# Patient Record
Sex: Female | Born: 2000 | Race: White | Hispanic: No | Marital: Single | Smoking: Former smoker
Health system: Southern US, Community
[De-identification: ages and names within clinical notes are randomized; demographics above are authoritative.]

## PROBLEM LIST (undated history)

## (undated) DIAGNOSIS — D649 Anemia, unspecified: Secondary | ICD-10-CM

## (undated) HISTORY — PX: TONSILLECTOMY: SUR1361

## (undated) NOTE — ED Notes (Signed)
 Formatting of this note might be different from the original. 10:19 AM  Carolyn Thompson Husband presented with Positive pregnancy test.  The patient has been assessed by a physician or advance practice provider and diagnosed with Problem List Items Addressed This Visit    None   Visit Diagnoses    Positive urine pregnancy test    -  Primary   Relevant Medications   prenatal vitamin low iron (PRENATAL PLUS/LOW IRON) 27-1 mg tablet   Other Relevant Orders   Amb referral to Obstetrics / Gynecology   .  The patient's condition is stable. Diagnostic tests were reviewed if ordered and questions answered. Diagnosis, care plan and treatment options were discussed. Patient given information to schedule referral appointment(s). RX provided to patient or electronically transmitted to patient's or care giver's requested pharmacy yes  The patient understand instructions and will follow up as directed.  Temp: 36.6 C (97.9 F) Heart Rate: 84 Resp: 18 Blood Pressure: 122/77 SpO2: 100 %  Electronically signed by Gibson Baxter, RN at 01/13/2020 10:20 AM EDT

## (undated) NOTE — ED Provider Notes (Signed)
 Formatting of this note is different from the original.   URGENT CARE RAPID MEDICAL EXAMINATION NOTE     01/13/2020 10:14 AM  Subjective:  Chief Complaint:  Chief Complaint  Patient presents with  ? Pregnancy Test   HPI:(at least one Chief Complaint detail) Narrative: 42 yo female with h/o sepsis who presents for pregnancy test. Pt reports positive home pregnancy test. She denies any symptoms today. LMP: 12/01/19.  Review of Systems:(at least one ROS) History obtained from the patient General ROS: negative Gastrointestinal ROS: negative Genito-Urinary ROS: positive for - positive home pregnancy test  Past Medical History: The information below is populated from the nursing note but has been reviewed by the provider. History reviewed. No pertinent past medical history. History reviewed. No pertinent surgical history.  No family history on file. Social History   Socioeconomic History  ? Marital status: Single    Spouse name: None  ? Number of children: None  ? Years of education: None  ? Highest education level: None  Occupational History  ? None  Tobacco Use  ? Smoking status: Current Every Day Smoker  ? Smokeless tobacco: Never Used  Substance and Sexual Activity  ? Alcohol use: Not Currently  ? Drug use: Not Currently  ? Sexual activity: Yes    Partners: Male  Other Topics Concern  ? None  Social History Narrative  ? None   Social Determinants of Health   Financial Resource Strain:   ? Difficulty of Paying Living Expenses:   Food Insecurity:   ? Worried About Programme researcher, broadcasting/film/video in the Last Year:   ? Barista in the Last Year:   Transportation Needs:   ? Freight forwarder (Medical):   ? Lack of Transportation (Non-Medical):   Intimate Partner Violence:   ? Fear of Current or Ex-Partner:   ? Emotionally Abused:   ? Physically Abused:   ? Sexually Abused:    Objective: (at least two organ systems) VS: Temp: 36.6 C (97.9 F) Heart Rate:  84 Resp: 18 Blood Pressure: 122/77 SpO2: 100 % General appearance: alert and no distress Lungs: clear to auscultation bilaterally Heart: regular rate and rhythm, no murmur, click, rub or gallop Abdomen: soft, non-tender; bowel sounds normal Pulses: radial and DP 2+ and symmetric Skin: Warm, dry.  Normal skin color. No rashes or lesions Neurologic: A&Ox4; Grossly normal; ambulatory with stable gait Psych: She has normal mood and affect. Her behavior is normal. Judgement and thought content normal. V/S reviewed  Labs: Labs Reviewed  PREGNANCY TEST, HCG-URINE     Assessment/Diagnosis(es)   Plan/Recommendation   1. Positive pregnancy   //Will obtain UPT.   //Today you had a positive pregnancy test.  OB follow-up is a very important part of your care.  Initial obstetric/prenatal appointments are conducted by phone.  Patients desiring to initiate prenatal care must call the interviewers.  They can be reached Monday through Friday from 8:00 am to 4:30 pm through ?Prenatal Registration? at [phone] or [phone].   Disposition: Discharge: To Self  Condition: stable    Immaculata C. Gordan, NP Supervision: Associate Provider Supervised by [facility]    Gordan Hire, NP 01/17/20 1634  Cosigned by Shela Donnice LABOR., MD at 01/18/2020  7:48 AM EDT Electronically signed by Gordan Hire, NP at 01/17/2020  4:34 PM EDT Electronically signed by Shela Donnice LABOR., MD at 01/18/2020  7:48 AM EDT  Associated attestation - Shela Donnice Arch, MD - 01/18/2020  7:48 AM  EDT Formatting of this note might be different from the original. I was the attending of record and I am administratively signing this document.

---
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
## 2020-02-06 LAB — OB RESULTS CONSOLE RUBELLA ANTIBODY, IGM: Rubella: IMMUNE

## 2020-02-06 LAB — OB RESULTS CONSOLE VARICELLA ZOSTER ANTIBODY, IGG: Varicella: NON-IMMUNE/NOT IMMUNE

## 2020-02-06 LAB — OB RESULTS CONSOLE HEPATITIS B SURFACE ANTIGEN: Hepatitis B Surface Ag: NEGATIVE
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
## 2020-06-18 NOTE — L&D Delivery Note (Addendum)
Delivery Note  [name] is a U2P5361 at [redacted]w[redacted]d with an LMP of 12/06/2019 (approximate), consistent with [place] at [redacted]w[redacted]d.  Dating by 7 week [place] d/t unsure LMP.   First Stage: Labor onset:1200 Induction: oxytocin and AROM Analgesia /Anesthesia intrapartum: Epidural AROM at 1555  Second Stage: Complete dilation at 1707 Onset of pushing at 1714 FHR second stage 120 bpm with moderate variability, variables with pushing, terminal deceleration while crowning prior to delivery   Carolyn Thompson presented to L&D for scheduled IOL d/t advanced dilation and GBS positive.  She was actively managed with oxytocin for induction of labor.  She progressed well and requested an epidural for pain management.  AROM was performed after she was comfortable with her epidural and adequately treated for GBS with PCN.  She continue to progress quickly to C/C/+2 with an urge to push.  Delivery of a viable baby boy on 09/18/2020 at 1728 by CNM Delivery of fetal head in OA position with restitution to LOT. Loose nuchal cord x 1;  Anterior then posterior shoulders delivered easily with gentle downward traction.  Delivered through nuchal cord. Baby placed on mom's chest, and attended to by peds. Cord double clamped after cessation of pulsation, cut by father of baby.   Cord blood sample collected: N/A, B Pos  Third Stage: Oxytocin bolus started after delivery of infant for hemorrhage prophylaxis  Placenta delivered intact with 3 VC @ 1736 Placenta disposition: discarded  Uterine tone firm / bleeding moderate   Small periurethral abrasions identified, hemostatic Anesthesia for repair: N/A Repair: N/A Est. Blood Loss (mL): 200 ml  Complications: None  Mom to postpartum.  Baby to Couplet care / Skin to Skin.  Newborn: Information for the patient's newborn:  [name], [name] [[id number]]  Live born female "[name]" Birth Weight:  7lbs 8oz APGAR: 9, 9  Newborn Delivery   Birth date/time: 09/18/2020 17:28:00 Delivery  type: Vaginal, Spontaneous     Feeding planned: formula   ---------- Margaretmary Eddy, CNM Certified Nurse Midwife [place]  [facility]

## 2020-07-26 DIAGNOSIS — Z3490 Encounter for supervision of normal pregnancy, unspecified, unspecified trimester: Secondary | ICD-10-CM

## 2020-08-22 LAB — OB RESULTS CONSOLE RPR: RPR: NONREACTIVE

## 2020-08-22 LAB — OB RESULTS CONSOLE GC/CHLAMYDIA
Chlamydia: NEGATIVE
Gonorrhea: NEGATIVE

## 2020-08-22 LAB — OB RESULTS CONSOLE HIV ANTIBODY (ROUTINE TESTING): HIV: NONREACTIVE

## 2020-08-22 LAB — OB RESULTS CONSOLE GBS: GBS: POSITIVE

## 2020-09-11 ENCOUNTER — Other Ambulatory Visit: Payer: Self-pay

## 2020-09-11 ENCOUNTER — Observation Stay
Admission: EM | Admit: 2020-09-11 | Discharge: 2020-09-11 | Disposition: A | Discharge status: Home / Self Care | Payer: Medicaid Other | Attending: Obstetrics and Gynecology | Admitting: Obstetrics and Gynecology

## 2020-09-11 DIAGNOSIS — Z3A39 39 weeks gestation of pregnancy: Secondary | ICD-10-CM | POA: Diagnosis not present

## 2020-09-11 DIAGNOSIS — O471 False labor at or after 37 completed weeks of gestation: Principal | ICD-10-CM | POA: Insufficient documentation

## 2020-09-11 NOTE — Discharge Summary (Signed)
Carolyn Thompson is a 20 y.o. female. She is at [redacted]w[redacted]d gestation. No LMP recorded. Patient is pregnant. Estimated Date of Delivery: 09/15/20  Prenatal care site: [facility]  Current pregnancy complicated by:  1. Transfer in at 35wks, last visit 20wks in [place].  2. Elevated 1hr GTT at 35wks: 151  3hr GTT: 08/15/20: [id number] 3. Iron deficiency Anemia: hgb 10.8 at 35wks  Start twice daily iron supplement- 08/12/20 4. Teen pregnancy, age 24yo 5. 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 Varicella Non-Immune  Advise vaccine postpartum  Chief complaint: Irreg contractions since last night, no LOF or VB. Good FM.    S: Resting comfortably. no CTX, no VB.no LOF,  Active fetal movement. Denies: HA, visual changes, SOB, or RUQ/epigastric pain  Maternal Medical History:   Past Medical History:  Diagnosis Date  . Anemia     Past Surgical History:  Procedure Laterality Date  . TONSILLECTOMY     Age 54    No Known Allergies  Prior to Admission medications   Medication Sig Start Date End Date Taking? Authorizing Provider  ferrous sulfate 325 (65 FE) MG EC tablet Take 325 mg by mouth 3 (three) times daily with meals.   Yes [provider]  Prenatal Vit-Fe Fumarate-FA (MULTIVITAMIN-PRENATAL) 27-0.8 MG TABS tablet Take 1 tablet by mouth daily at 12 noon.   Yes [provider]  benzocaine-Menthol (DERMOPLAST) 20-0.5 % AERO Apply 1 application topically as needed for irritation (perineal discomfort). Patient not taking: Reported on 09/11/2020 11/10/18   Schermerhorn, Ihor Austin, MD  ibuprofen (ADVIL) 600 MG tablet Take 1 tablet (600 mg total) by mouth every 6 (six) hours. Patient not taking: Reported on 09/11/2020 11/10/18   Schermerhorn, Ihor Austin, MD  medroxyPROGESTERone (DEPO-PROVERA) 150 MG/ML injection Inject 1 mL (150 mg total) into the muscle every 3 (three) months. Patient not taking: Reported on 09/11/2020 11/10/18   Schermerhorn, Ihor Austin, MD      Social History:  She  reports that she has never smoked. She has never used smokeless tobacco. She reports previous alcohol use. She reports previous drug use.  Family History:  no history of gyn cancers  Review of Systems: A full review of systems was performed and negative except as noted in the HPI.     O:  BP 133/89 (BP Location: Left Arm)   Pulse 94   Temp 98.4 F (36.9 C) (Oral)   Resp 16   Ht 5\' 5"  (1.651 m)   Wt 65.3 kg   BMI 23.96 kg/m  No results found for this or any previous visit (from the past 48 hour(s)).   Constitutional: NAD, AAOx3  HE/ENT: extraocular movements grossly intact, moist mucous membranes CV: RRR PULM: nl respiratory effort, CTABL     Abd: gravid, non-tender, non-distended, soft      Ext: Non-tender, Nonedematous   Psych: mood appropriate, speech normal Pelvic:  SVE 3/75/-2, soft/midposition  Fetal  monitoring: Cat I Appropriate for GA Baseline:  Variability: moderate Accelerations: absent/ present x >2 Decelerations absent Time 05-26-1987    A/P: 20 y.o. [redacted]w[redacted]d here for antenatal surveillance for early labor  Principle Diagnosis:  False labor at 39wks   Labor: not present. No cervical change.   Fetal Wellbeing: Reassuring Cat 1 tracing.  Reactive NST   Reviewed comfort measures- encouraged PO hydration, tylenol and benadryl to help her sleep.   D/c home stable, precautions reviewed, follow-up as scheduled.    [redacted]w[redacted]d, CNM 09/11/2020 5:00 PM

## 2020-09-11 NOTE — OB Triage Note (Signed)
Patient G2P1 [redacted]w[redacted]d presents to L&D with complaints of contractions since last night. She reports that they have been irregular in frequency, but had some as close at every 5 minutes apart. Patient denies leaking of fluid and vaginal bleeding and reports positive fetal movement. VSS. Monitors applied and assessing. Cervical exam was 3/80/-2.

## 2020-09-11 NOTE — OB Triage Note (Signed)
CNM and RN at bedside to discuss plan for discharge. CNM went over discharge instructions and OTC medications with patient and patient verbalized understanding.

## 2020-09-16 ENCOUNTER — Other Ambulatory Visit: Payer: Self-pay

## 2020-09-16 ENCOUNTER — Other Ambulatory Visit
Admission: RE | Admit: 2020-09-16 | Discharge: 2020-09-16 | Disposition: A | Discharge status: Home / Self Care | Payer: Medicaid Other | Source: Ambulatory Visit | Attending: Obstetrics and Gynecology | Admitting: Obstetrics and Gynecology

## 2020-09-16 DIAGNOSIS — Z20822 Contact with and (suspected) exposure to covid-19: Secondary | ICD-10-CM | POA: Diagnosis not present

## 2020-09-16 DIAGNOSIS — Z01812 Encounter for preprocedural laboratory examination: Secondary | ICD-10-CM | POA: Diagnosis present

## 2020-09-16 LAB — SARS CORONAVIRUS 2 (TAT 6-24 HRS): SARS Coronavirus 2: NEGATIVE

## 2020-09-17 ENCOUNTER — Other Ambulatory Visit: Payer: Self-pay

## 2020-09-17 DIAGNOSIS — Z3490 Encounter for supervision of normal pregnancy, unspecified, unspecified trimester: Secondary | ICD-10-CM

## 2020-09-17 NOTE — Progress Notes (Signed)
G2P1001 at 107w3d, LMP of 12/06/2019, c/w early [place] at [redacted]w[redacted]d.  Scheduled for induction of labor for advance dilation and GBS pos on 09/18/2020 at 0500.   Prenatal provider: [facility] Pregnancy complicated by: 1. Lapse in prenatal care - transfer of care at 35 weeks, last prenatal visit in [place] at 20 weeks  2. Elevated 1hr GTT at 35 weeks, 3hr WNL 3. Maternal iron deficiency anemia in pregnancy  4. Teen pregnancy  5. 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 Varicella non-immune   Prenatal Labs: Blood type/Rh B pos   Antibody screen neg  Rubella Immune  Varicella Non-Immune  RPR NR  HBsAg Neg  HIV NR  GC neg  Chlamydia neg  Genetic screening negative  1 hour GTT 151  3 hour GTT 94, 173, 106, 109  GBS Positive    Tdap: given 08/11/2020 Flu: declined  Contraception: depo  Feeding preference: formula   ____ Margaretmary Eddy, CNM Certified Nurse Midwife [place]  [facility]

## 2020-09-18 ENCOUNTER — Inpatient Hospital Stay
Admission: EM | Admit: 2020-09-18 | Discharge: 2020-09-20 | DRG: 806 | Disposition: A | Discharge status: Home / Self Care | Payer: Medicaid Other

## 2020-09-18 ENCOUNTER — Inpatient Hospital Stay: Payer: Medicaid Other | Admitting: Anesthesiology

## 2020-09-18 ENCOUNTER — Other Ambulatory Visit: Payer: Self-pay

## 2020-09-18 ENCOUNTER — Encounter: Payer: Self-pay | Admitting: Obstetrics and Gynecology

## 2020-09-18 DIAGNOSIS — D509 Iron deficiency anemia, unspecified: Secondary | ICD-10-CM | POA: Diagnosis present

## 2020-09-18 DIAGNOSIS — O9081 Anemia of the puerperium: Secondary | ICD-10-CM | POA: Diagnosis not present

## 2020-09-18 DIAGNOSIS — O99824 Streptococcus B carrier state complicating childbirth: Secondary | ICD-10-CM | POA: Diagnosis present

## 2020-09-18 DIAGNOSIS — O26893 Other specified pregnancy related conditions, third trimester: Secondary | ICD-10-CM | POA: Diagnosis present

## 2020-09-18 DIAGNOSIS — O0933 Supervision of pregnancy with insufficient antenatal care, third trimester: Secondary | ICD-10-CM

## 2020-09-18 DIAGNOSIS — D62 Acute posthemorrhagic anemia: Secondary | ICD-10-CM | POA: Diagnosis not present

## 2020-09-18 DIAGNOSIS — R519 Headache, unspecified: Secondary | ICD-10-CM | POA: Diagnosis not present

## 2020-09-18 DIAGNOSIS — Z3A40 40 weeks gestation of pregnancy: Secondary | ICD-10-CM | POA: Diagnosis not present

## 2020-09-18 DIAGNOSIS — O99893 Other specified diseases and conditions complicating puerperium: Secondary | ICD-10-CM | POA: Diagnosis present

## 2020-09-18 DIAGNOSIS — D500 Iron deficiency anemia secondary to blood loss (chronic): Secondary | ICD-10-CM | POA: Diagnosis not present

## 2020-09-18 DIAGNOSIS — Z3490 Encounter for supervision of normal pregnancy, unspecified, unspecified trimester: Secondary | ICD-10-CM | POA: Diagnosis present

## 2020-09-18 DIAGNOSIS — O99013 Anemia complicating pregnancy, third trimester: Secondary | ICD-10-CM | POA: Diagnosis present

## 2020-09-18 DIAGNOSIS — R2243 Localized swelling, mass and lump, lower limb, bilateral: Secondary | ICD-10-CM | POA: Diagnosis not present

## 2020-09-18 LAB — CBC
HCT: 35.5 % — ABNORMAL LOW (ref 36.0–46.0)
Hemoglobin: 11.5 g/dL — ABNORMAL LOW (ref 12.0–15.0)
MCH: 25.7 pg — ABNORMAL LOW (ref 26.0–34.0)
MCHC: 32.4 g/dL (ref 30.0–36.0)
MCV: 79.4 fL — ABNORMAL LOW (ref 80.0–100.0)
Platelets: 176 10*3/uL (ref 150–400)
RBC: 4.47 MIL/uL (ref 3.87–5.11)
RDW: 16.4 % — ABNORMAL HIGH (ref 11.5–15.5)
WBC: 7.6 10*3/uL (ref 4.0–10.5)
nRBC: 0 % (ref 0.0–0.2)

## 2020-09-18 LAB — TYPE AND SCREEN
ABO/RH(D): B POS
Antibody Screen: NEGATIVE

## 2020-09-18 MED ORDER — ONDANSETRON HCL 4 MG PO TABS: 4.0000 mg | ORAL_TABLET | ORAL | Status: DC | PRN

## 2020-09-18 MED ORDER — DIBUCAINE (PERIANAL) 1 % EX OINT: 1.0000 "application " | TOPICAL_OINTMENT | CUTANEOUS | Status: DC | PRN

## 2020-09-18 MED ORDER — OXYTOCIN 10 UNIT/ML IJ SOLN
INTRAMUSCULAR | Status: AC
  Filled 2020-09-18: qty 2

## 2020-09-18 MED ORDER — OXYTOCIN-SODIUM CHLORIDE 30-0.9 UT/500ML-% IV SOLN
1.0000 m[IU]/min | INTRAVENOUS | Status: DC
  Administered 2020-09-18: 2 m[IU]/min via INTRAVENOUS

## 2020-09-18 MED ORDER — EPHEDRINE 5 MG/ML INJ: 10.0000 mg | INTRAVENOUS | Status: DC | PRN

## 2020-09-18 MED ORDER — LACTATED RINGERS IV SOLN: INTRAVENOUS | Status: DC

## 2020-09-18 MED ORDER — BENZOCAINE-MENTHOL 20-0.5 % EX AERO
1.0000 "application " | INHALATION_SPRAY | CUTANEOUS | Status: DC | PRN
  Filled 2020-09-18: qty 56

## 2020-09-18 MED ORDER — SIMETHICONE 80 MG PO CHEW
80.0000 mg | CHEWABLE_TABLET | ORAL | Status: DC | PRN
Start: 2020-09-18 — End: 2020-09-20

## 2020-09-18 MED ORDER — FENTANYL 2.5 MCG/ML W/ROPIVACAINE 0.15% IN NS 100 ML EPIDURAL (ARMC)
EPIDURAL | Status: AC
  Filled 2020-09-18: qty 100

## 2020-09-18 MED ORDER — ONDANSETRON HCL 4 MG/2ML IJ SOLN: 4.0000 mg | Freq: Four times a day (QID) | INTRAMUSCULAR | Status: DC | PRN

## 2020-09-18 MED ORDER — WITCH HAZEL-GLYCERIN EX PADS
1.0000 "application " | MEDICATED_PAD | CUTANEOUS | Status: DC
  Administered 2020-09-18: 1 via TOPICAL
  Filled 2020-09-18: qty 100

## 2020-09-18 MED ORDER — PENICILLIN G POT IN DEXTROSE 60000 UNIT/ML IV SOLN
3.0000 10*6.[IU] | INTRAVENOUS | Status: DC
  Administered 2020-09-18 (×2): 3 10*6.[IU] via INTRAVENOUS
  Filled 2020-09-18 (×2): qty 50

## 2020-09-18 MED ORDER — DOCUSATE SODIUM 100 MG PO CAPS
100.0000 mg | ORAL_CAPSULE | Freq: Two times a day (BID) | ORAL | Status: DC
  Administered 2020-09-18 – 2020-09-20 (×3): 100 mg via ORAL
  Filled 2020-09-18 (×3): qty 1

## 2020-09-18 MED ORDER — LIDOCAINE HCL (PF) 1 % IJ SOLN
INTRAMUSCULAR | Status: DC | PRN
  Administered 2020-09-18: 3 mL

## 2020-09-18 MED ORDER — FERROUS SULFATE 325 (65 FE) MG PO TABS
325.0000 mg | ORAL_TABLET | Freq: Two times a day (BID) | ORAL | Status: DC
  Administered 2020-09-19 – 2020-09-20 (×3): 325 mg via ORAL
  Filled 2020-09-18 (×3): qty 1

## 2020-09-18 MED ORDER — ACETAMINOPHEN 500 MG PO TABS: 1000.0000 mg | ORAL_TABLET | Freq: Four times a day (QID) | ORAL | Status: DC | PRN

## 2020-09-18 MED ORDER — FENTANYL CITRATE (PF) 100 MCG/2ML IJ SOLN: 50.0000 ug | INTRAMUSCULAR | Status: DC | PRN

## 2020-09-18 MED ORDER — PHENYLEPHRINE 40 MCG/ML (10ML) SYRINGE FOR IV PUSH (FOR BLOOD PRESSURE SUPPORT): 80.0000 ug | PREFILLED_SYRINGE | INTRAVENOUS | Status: DC | PRN

## 2020-09-18 MED ORDER — OXYTOCIN-SODIUM CHLORIDE 30-0.9 UT/500ML-% IV SOLN
2.5000 [IU]/h | INTRAVENOUS | Status: DC
  Administered 2020-09-18 (×2): 2.5 [IU]/h via INTRAVENOUS
  Filled 2020-09-18 (×2): qty 500

## 2020-09-18 MED ORDER — LIDOCAINE HCL (PF) 1 % IJ SOLN
30.0000 mL | INTRAMUSCULAR | Status: DC | PRN
  Filled 2020-09-18: qty 30

## 2020-09-18 MED ORDER — BUPIVACAINE HCL (PF) 0.25 % IJ SOLN
INTRAMUSCULAR | Status: DC | PRN
  Administered 2020-09-18: 6 mL via EPIDURAL

## 2020-09-18 MED ORDER — FENTANYL 2.5 MCG/ML W/ROPIVACAINE 0.15% IN NS 100 ML EPIDURAL (ARMC)
12.0000 mL/h | EPIDURAL | Status: DC
Start: 2020-09-18 — End: 2020-09-18

## 2020-09-18 MED ORDER — COCONUT OIL OIL: 1.0000 "application " | TOPICAL_OIL | Status: DC | PRN

## 2020-09-18 MED ORDER — PRENATAL MULTIVITAMIN CH
1.0000 | ORAL_TABLET | Freq: Every day | ORAL | Status: DC
  Administered 2020-09-19 – 2020-09-20 (×2): 1 via ORAL
  Filled 2020-09-18 (×2): qty 1

## 2020-09-18 MED ORDER — AMMONIA AROMATIC IN INHA
RESPIRATORY_TRACT | Status: AC
  Filled 2020-09-18: qty 10

## 2020-09-18 MED ORDER — IBUPROFEN 600 MG PO TABS
600.0000 mg | ORAL_TABLET | Freq: Four times a day (QID) | ORAL | Status: DC
  Administered 2020-09-18 – 2020-09-20 (×6): 600 mg via ORAL
  Filled 2020-09-18 (×6): qty 1

## 2020-09-18 MED ORDER — ACETAMINOPHEN 500 MG PO TABS
1000.0000 mg | ORAL_TABLET | Freq: Four times a day (QID) | ORAL | Status: DC | PRN
  Administered 2020-09-18 – 2020-09-20 (×5): 1000 mg via ORAL
  Filled 2020-09-18 (×5): qty 2

## 2020-09-18 MED ORDER — OXYTOCIN BOLUS FROM INFUSION
333.0000 mL | Freq: Once | INTRAVENOUS | Status: AC
  Administered 2020-09-18: 333 mL via INTRAVENOUS

## 2020-09-18 MED ORDER — TERBUTALINE SULFATE 1 MG/ML IJ SOLN: 0.2500 mg | Freq: Once | INTRAMUSCULAR | Status: DC | PRN

## 2020-09-18 MED ORDER — DIPHENHYDRAMINE HCL 50 MG/ML IJ SOLN: 12.5000 mg | INTRAMUSCULAR | Status: DC | PRN

## 2020-09-18 MED ORDER — LACTATED RINGERS IV SOLN: 500.0000 mL | Freq: Once | INTRAVENOUS | Status: AC

## 2020-09-18 MED ORDER — CALCIUM CARBONATE ANTACID 500 MG PO CHEW: 400.0000 mg | CHEWABLE_TABLET | Freq: Three times a day (TID) | ORAL | Status: DC | PRN

## 2020-09-18 MED ORDER — LACTATED RINGERS IV SOLN
500.0000 mL | INTRAVENOUS | Status: DC | PRN
  Administered 2020-09-18: 500 mL via INTRAVENOUS

## 2020-09-18 MED ORDER — FENTANYL 2.5 MCG/ML W/ROPIVACAINE 0.15% IN NS 100 ML EPIDURAL (ARMC)
EPIDURAL | Status: DC | PRN
  Administered 2020-09-18: 12 mL/h via EPIDURAL

## 2020-09-18 MED ORDER — MEDROXYPROGESTERONE ACETATE 150 MG/ML IM SUSP
150.0000 mg | INTRAMUSCULAR | Status: AC | PRN
  Administered 2020-09-20: 150 mg via INTRAMUSCULAR
  Filled 2020-09-18 (×2): qty 1

## 2020-09-18 MED ORDER — VARICELLA VIRUS VACCINE LIVE 1350 PFU/0.5ML IJ SUSR
0.5000 mL | INTRAMUSCULAR | Status: DC | PRN
  Filled 2020-09-18: qty 0.5

## 2020-09-18 MED ORDER — MISOPROSTOL 200 MCG PO TABS
ORAL_TABLET | ORAL | Status: AC
  Filled 2020-09-18: qty 4

## 2020-09-18 MED ORDER — SODIUM CHLORIDE 0.9 % IV SOLN
5.0000 10*6.[IU] | Freq: Once | INTRAVENOUS | Status: AC
  Administered 2020-09-18: 5 10*6.[IU] via INTRAVENOUS
  Filled 2020-09-18: qty 5

## 2020-09-18 MED ORDER — ONDANSETRON HCL 4 MG/2ML IJ SOLN: 4.0000 mg | INTRAMUSCULAR | Status: DC | PRN

## 2020-09-18 MED ORDER — LIDOCAINE-EPINEPHRINE (PF) 1.5 %-1:200000 IJ SOLN
INTRAMUSCULAR | Status: DC | PRN
  Administered 2020-09-18: 3 mL via PERINEURAL

## 2020-09-18 MED ORDER — DIPHENHYDRAMINE HCL 25 MG PO CAPS: 25.0000 mg | ORAL_CAPSULE | Freq: Four times a day (QID) | ORAL | Status: DC | PRN

## 2020-09-18 NOTE — Progress Notes (Signed)
Patient ID: Carolyn Thompson, female   DOB: 2001-05-02, 20 y.o.   MRN: [id number] Update received from [facility] . Amnioinfusion ongoing . Reassuring fetal monitoring . Observe for tachysystole.

## 2020-09-18 NOTE — Progress Notes (Addendum)
Labor Progress Note  Carolyn Thompson is a 20 y.o. G2P1001 at [redacted]w[redacted]d by ultrasound admitted for induction of labor for advanced dilation and GBS pos.  Subjective: comfortable with epidural   Objective: BP 138/86   Pulse (!) 102   Temp 98.3 F (36.8 C) (Axillary)   Resp 16   Ht 5\' 4"  (1.626 m)   Wt 66.2 kg   SpO2 100%   BMI 25.06 kg/m  Notable VS details: reviewed   Fetal Assessment: FHT:  FHR: 125 bpm, variability: moderate,  accelerations:  Present,  decelerations:  Absent Category/reactivity:  Category I UC:   regular, every 2-3 minutes SVE:   7/90/+1 Membrane status: AROM at 1555 Amniotic color: clear   Labs: Lab Results  Component Value Date   WBC 7.6 09/18/2020   HGB 11.5 (L) 09/18/2020   HCT 35.5 (L) 09/18/2020   MCV 79.4 (L) 09/18/2020   PLT 176 09/18/2020    Assessment / Plan: Induction of labor due to advanced dilation and gbs Pos,  progressing well on pitocin  Labor: Progressing normally Preeclampsia:  no signs or symptoms of toxicity Fetal Wellbeing:  Category I Pain Control:  Epidural I/D:  GBS, PCN x 3 doses, afebrile Anticipated MOD:  NSVD  11/18/2020, CNM 09/18/2020, 4:04 PM

## 2020-09-18 NOTE — Anesthesia Procedure Notes (Signed)
Epidural Patient location during procedure: OB Start time: 09/18/2020 3:00 PM End time: 09/18/2020 3:56 PM  Staffing Anesthesiologist: Yves Dill, MD Performed: anesthesiologist   Preanesthetic Checklist Completed: patient identified, IV checked, site marked, risks and benefits discussed, surgical consent, monitors and equipment checked, pre-op evaluation and timeout performed  Epidural Patient position: sitting Prep: Betadine Patient monitoring: heart rate, continuous pulse ox and blood pressure Approach: midline Location: L3-L4 Injection technique: LOR air  Needle:  Needle type: Tuohy  Needle gauge: 17 G Needle length: 9 cm and 9 Catheter type: closed end flexible Catheter size: 19 Gauge Test dose: negative and 1.5% lidocaine with Epi 1:200 K  Assessment Events: blood not aspirated, injection not painful, no injection resistance, no paresthesia and negative IV test  Additional Notes Time out called.  Patient placed in sitting position.  Back prepped and draped in sterile fashion.  A skin wheal was made in the L3-L4 interspace with 1% Lidocaine plain.  A 17G Tuohy needle was advanced into the epidural space by a loss of resistance technique. The epidural catheter was threaded 3 cm and the TD was negative.  No blood, fluid or paresthesias.  The patient tolerated the procedure well and the catheter was affixed to the back in sterile fashion.Reason for block:procedure for pain

## 2020-09-18 NOTE — Anesthesia Preprocedure Evaluation (Signed)
Anesthesia Evaluation  Patient identified by MRN, date of birth, ID band Patient awake    Reviewed: Allergy & Precautions, H&P , NPO status , Patient's Chart, lab work & pertinent test results, reviewed documented beta blocker date and time   Airway Mallampati: II  TM Distance: >3 FB Neck ROM: full    Dental no notable dental hx. (+) Teeth Intact   Pulmonary neg pulmonary ROS, Current Smoker,    Pulmonary exam normal breath sounds clear to auscultation       Cardiovascular Exercise Tolerance: Good negative cardio ROS   Rhythm:regular Rate:Normal     Neuro/Psych negative neurological ROS  negative psych ROS   GI/Hepatic negative GI ROS, Neg liver ROS,   Endo/Other  negative endocrine ROSdiabetes  Renal/GU      Musculoskeletal   Abdominal   Peds  Hematology  (+) Blood dyscrasia, anemia ,   Anesthesia Other Findings   Reproductive/Obstetrics (+) Pregnancy                             Anesthesia Physical  Anesthesia Plan  ASA: II  Anesthesia Plan: Epidural   Post-op Pain Management:    Induction:   PONV Risk Score and Plan:   Airway Management Planned: Natural Airway  Additional Equipment:   Intra-op Plan:   Post-operative Plan:   Informed Consent: I have reviewed the patients History and Physical, chart, labs and discussed the procedure including the risks, benefits and alternatives for the proposed anesthesia with the patient or authorized representative who has indicated his/her understanding and acceptance.     Dental advisory given  Plan Discussed with: CRNA and Surgeon  Anesthesia Plan Comments:         Anesthesia Quick Evaluation

## 2020-09-18 NOTE — H&P (Addendum)
OB History & Physical   History of Present Illness:   Chief Complaint: scheduled IOL   HPI:  Carolyn Thompson is a 20 y.o. G2P1001 female at [redacted]w[redacted]d dated by [place] at [redacted]w[redacted]d, c/w with approximate LMP of 12/06/2019.  Dating by early [place] d/t unsure LMP. She presents to L&D for scheduled IOL for advanced dilation and GBS positive.   Reports active fetal movement  Contractions: denies  LOF/SROM: denies  Vaginal bleeding: denies   Pregnancy Issues: 1. Lapse in prenatal care - transfer of care at 35 weeks, last prenatal visit in [place] at 20 weeks  2. Elevated 1hr GTT at 35 weeks, 3hr WNL 3. Maternal iron deficiency anemia in pregnancy  4. Teen pregnancy  5. 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 Varicella non-immune   Patient Active Problem List   Diagnosis Date Noted  . Encounter for induction of labor 09/18/2020  . Limited prenatal care in third trimester 09/18/2020  . Maternal iron deficiency anemia affecting pregnancy in third trimester, antepartum 09/18/2020  . Encounter for supervision of normal pregnancy 07/26/2020    Maternal Medical History:   Past Medical History:  Diagnosis Date  . Anemia     Past Surgical History:  Procedure Laterality Date  . TONSILLECTOMY     Age 35    No Known Allergies  Prior to Admission medications   Medication Sig Start Date End Date Taking? Authorizing Provider  ferrous sulfate 325 (65 FE) MG EC tablet Take 325 mg by mouth 3 (three) times daily with meals.   Yes [provider]  Prenatal Vit-Fe Fumarate-FA (MULTIVITAMIN-PRENATAL) 27-0.8 MG TABS tablet Take 1 tablet by mouth daily at 12 noon.   Yes [provider]     Prenatal care site:  [facility]  Social History: She  reports that she has never smoked. She has never used smokeless tobacco. She reports previous alcohol use. She reports previous drug use.  Family History: family history is not on file.   Review of Systems: A full review of systems was  performed and negative except as noted in the HPI.     Physical Exam:  Vital Signs: BP 121/66 (BP Location: Left Arm)   Pulse 81   Temp 97.9 F (36.6 C) (Oral)   Resp 14   Ht 5\' 4"  (1.626 m)   Wt 66.2 kg   BMI 25.06 kg/m  Physical Exam  General: no acute distress.  HEENT: normocephalic, atraumatic Heart: regular rate & rhythm.  No murmurs/rubs/gallops Lungs: clear to auscultation bilaterally, normal respiratory effort Abdomen: soft, gravid, non-tender;  EFW: 7 1/2 lbs  Pelvic:   External: Normal external female genitalia  Cervix: Dilation: 4.5 / Effacement (%): 80 / Station: -2    Extremities: non-tender, symmetric, no edema bilaterally.  DTRs: 2+/2+  Neurologic: Alert & oriented x 3.    Results for orders placed or performed during the hospital encounter of 09/18/20 (from the past 24 hour(s))  CBC     Status: Abnormal   Collection Time: 09/18/20  5:59 AM  Result Value Ref Range   WBC 7.6 4.0 - 10.5 K/uL   RBC 4.47 3.87 - 5.11 MIL/uL   Hemoglobin 11.5 (L) 12.0 - 15.0 g/dL   HCT 11/18/20 (L) 62.9 - 52.8 %   MCV 79.4 (L) 80.0 - 100.0 fL   MCH 25.7 (L) 26.0 - 34.0 pg   MCHC 32.4 30.0 - 36.0 g/dL   RDW 41.3 (H) 24.4 - 01.0 %   Platelets 176  150 - 400 K/uL   nRBC 0.0 0.0 - 0.2 %  Type and screen     Status: None   Collection Time: 09/18/20  5:59 AM  Result Value Ref Range   ABO/RH(D) B POS    Antibody Screen NEG    Sample Expiration      09/21/2020,2359 Performed at [facility], [address] [place], [place] [address]     Pertinent Results:  Prenatal Labs: Blood type/Rh B pos   Antibody screen neg  Rubella Immune  Varicella Non-Immune  RPR NR  HBsAg Neg  HIV NR  GC neg  Chlamydia neg  Genetic screening negative  1 hour GTT 151  3 hour GTT 94, 173, 106, 109  GBS Positive     FHT: Baseline: 120 bpm, Variability: moderate, Accelerations: present and Decelerations: Absent TOCO: Irregular, mild contractions  SVE:  Dilation: 4.5 / Effacement (%):  80 / Station: -2    Cephalic by Leopolds and SVE   No results found.  Assessment:  Carolyn Thompson is a 20 y.o. G57P1001 female at [redacted]w[redacted]d with advanced dilation and GBS positive.   Plan:  1. Admit to Labor & Delivery; consents reviewed and obtained - Covid admission screen   2. Fetal Well being  - Fetal Tracing: cat 1   - Group B Streptococcus ppx indicated: GBS pos - first dose of PCN given at 0600 - Presentation: cephalic confirmed by Leopolds and SVE   3. Routine OB: - Prenatal labs reviewed, as above - Rh pos - CBC, T&S, RPR on admit - Reg diet, IVF  4. Induction of labor  -  Contractions monitored with external toco -  Pelvis proven to 3410g   -  Plan for induction with oxytocin -  AROM as appropriate  -  Plan for  continuous fetal monitoring -  Maternal pain control as desired; planning regional anesthesia - Anticipate vaginal delivery  5. Post Partum Planning: - Infant feeding: formula - Contraception: Depo - Tdap vaccine: given 08/11/2020 - Flu vaccine: declined   Gustavo Lah, CNM 09/18/20 8:35 AM  Margaretmary Eddy, CNM Certified Nurse Midwife [place]  [facility]

## 2020-09-18 NOTE — Plan of Care (Signed)
Transferred to Room 336. Oriented to Room, Safety and Security and [employer]. Education initiated. Assessment and VS WNL. Denies c/o.

## 2020-09-18 NOTE — Plan of Care (Signed)
  Problem: Education: Goal: Knowledge of General Education information will improve Description: Including pain rating scale, medication(s)/side effects and non-pharmacologic comfort measures Outcome: Progressing   Problem: Health Behavior/Discharge Planning: Goal: Ability to manage health-related needs will improve Outcome: Progressing   Problem: Clinical Measurements: Goal: Ability to maintain clinical measurements within normal limits will improve Outcome: Progressing   Problem: Clinical Measurements: Goal: Will remain free from infection Outcome: Progressing   Problem: Activity: Goal: Risk for activity intolerance will decrease Outcome: Progressing   Problem: Nutrition: Goal: Adequate nutrition will be maintained Outcome: Progressing   Problem: Elimination: Goal: Will not experience complications related to bowel motility Outcome: Progressing   Problem: Pain Managment: Goal: General experience of comfort will improve Outcome: Progressing   Problem: Safety: Goal: Ability to remain free from injury will improve Outcome: Progressing

## 2020-09-18 NOTE — Discharge Summary (Addendum)
Obstetrical Discharge Summary  Patient Name: Carolyn Thompson DOB: Oct 05, 2000 MRN: [id number]  Date of Admission: 09/18/2020 Date of Delivery: 09/18/2020 Delivered by: Margaretmary Eddy, CNM  Date of Discharge: 09/20/2020  Primary [facility]: [facility] [id number] last menstrual period was 12/06/2019. EDC Estimated Date of Delivery: 09/15/20 Gestational Age at Delivery: [redacted]w[redacted]d   Antepartum complications:  1. Lapse in prenatal care - transfer of care at 35 weeks, last prenatal visit in [place] at 20 weeks  2. Elevated 1hr GTT at 35 weeks, 3hr WNL 3. Maternal iron deficiency anemia in pregnancy  4. Teen pregnancy  5. 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 Varicella non-immune  Admitting Diagnosis: Scheduled IOL for advanced dilation and GBS Positive  Secondary Diagnosis: Patient Active Problem List   Diagnosis Date Noted  . NSVD (normal spontaneous vaginal delivery) 09/19/2020  . Blood loss anemia 09/19/2020  . Limited prenatal care in third trimester 09/18/2020  . Maternal iron deficiency anemia affecting pregnancy in third trimester, antepartum 09/18/2020    Induction: AROM and Pitocin Complications: None Intrapartum complications/course: Lavella presented to L&D for scheduled IOL d/t advanced dilation and GBS positive.  She was actively managed with oxytocin for induction of labor.  She progressed well and requested an epidural for pain management.  AROM was performed after she was comfortable with her epidural and adequately treated for GBS with PCN.  She continue to progress quickly to C/C/+2 with an urge to push.  Delivery Type: spontaneous vaginal delivery Anesthesia: epidural Placenta: spontaneous Laceration: small periurethral abrasions, hemostatic, no repair  Episiotomy: none Newborn Data: Live born female "Carolyn Thompson" Birth Weight:  7lbs 8oz APGAR: 9, 9  Newborn Delivery   Birth date/time: 09/18/2020 17:28:00 Delivery type: Vaginal, Spontaneous      Postpartum Procedures:  None Edinburgh:  Edinburgh Postnatal Depression Scale Screening Tool 09/18/2020  I have been able to laugh and see the funny side of things. 0  I have looked forward with enjoyment to things. 0  I have blamed myself unnecessarily when things went wrong. 1  I have been anxious or worried for no good reason. 1  I have felt scared or panicky for no good reason. 0  Things have been getting on top of me. 0  I have been so unhappy that I have had difficulty sleeping. 0  I have felt sad or miserable. 1  I have been so unhappy that I have been crying. 0  The thought of harming myself has occurred to me. 0  Edinburgh Postnatal Depression Scale Total 3     Post partum course:  Patient had an uncomplicated postpartum course.  By time of discharge on PPD#2, her pain was controlled on oral pain medications; she had appropriate lochia and was ambulating, voiding without difficulty and tolerating regular diet.  She was deemed stable for discharge to home.       Discharge Physical Exam: 09/20/2020 8:15 AM'  BP 126/82   Pulse 71   Temp 98.4 F (36.9 C) (Oral)   Resp 18   Ht 5\' 4"  (1.626 m)   Wt 66.2 kg   LMP 12/06/2019   SpO2 100%   Breastfeeding Unknown   BMI 25.06 kg/m   General: NAD CV: RRR Pulm: CTABL, nl effort ABD: s/nd/nt, fundus firm and below the umbilicus Lochia: moderate Perineum:minimal edema/intact DVT Evaluation: LE non-ttp, no evidence of DVT on exam.  Hemoglobin  Date Value Ref Range Status  09/19/2020 9.9 (L) 12.0 - 15.0 g/dL Final   HCT  Date  Value Ref Range Status  09/19/2020 31.0 (L) 36.0 - 46.0 % Final     Disposition: stable, discharge to home. Baby Feeding: formula Baby Disposition: home with mom  Rh Immune globulin given: Rh pos Rubella vaccine given: Immune Varivax vaccine given: non-immune, varivax to be given postpartum Flu vaccine given in AP or PP setting: declined  Tdap vaccine given in AP or PP setting: 08/11/2020  Contraception: Depo, first  injection given prior to discharge   Prenatal Labs:  Blood type/Rh B pos  Antibody screen neg  Rubella Immune  Varicella Non-Immune  RPR NR  HBsAg Neg  HIV NR  GC neg  Chlamydia neg  Genetic screening negative  1 hour GTT 151  3 hour GTT 94, 173, 106, 109  GBS Positive    Plan:  Jacklynn Lewis was discharged to home in good condition. Follow-up appointment with delivering provider in 6 weeks.  Discharge Medications: Allergies as of 09/20/2020   No Known Allergies     Medication List    TAKE these medications   ferrous sulfate 325 (65 FE) MG EC tablet Take 325 mg by mouth 3 (three) times daily with meals.   multivitamin-prenatal 27-0.8 MG Tabs tablet Take 1 tablet by mouth daily at 12 noon.        Follow-up Information    Gustavo Lah, CNM. Schedule an appointment as soon as possible for a visit in 6 week(s).   Specialty: Certified Nurse Midwife Contact information: [address] [place] [address] [phone]               Signed: Randa Ngo 09/20/2020 8:15 AM

## 2020-09-19 ENCOUNTER — Other Ambulatory Visit: Payer: Medicaid Other

## 2020-09-19 DIAGNOSIS — D500 Iron deficiency anemia secondary to blood loss (chronic): Secondary | ICD-10-CM | POA: Diagnosis not present

## 2020-09-19 LAB — CBC
HCT: 31 % — ABNORMAL LOW (ref 36.0–46.0)
Hemoglobin: 9.9 g/dL — ABNORMAL LOW (ref 12.0–15.0)
MCH: 25.6 pg — ABNORMAL LOW (ref 26.0–34.0)
MCHC: 31.9 g/dL (ref 30.0–36.0)
MCV: 80.3 fL (ref 80.0–100.0)
Platelets: 157 10*3/uL (ref 150–400)
RBC: 3.86 MIL/uL — ABNORMAL LOW (ref 3.87–5.11)
RDW: 16.6 % — ABNORMAL HIGH (ref 11.5–15.5)
WBC: 9.8 10*3/uL (ref 4.0–10.5)
nRBC: 0 % (ref 0.0–0.2)

## 2020-09-19 LAB — RPR: RPR Ser Ql: NONREACTIVE

## 2020-09-19 NOTE — Anesthesia Postprocedure Evaluation (Signed)
Anesthesia Post Note  Patient: Carolyn Thompson  Procedure(s) Performed: AN AD HOC LABOR EPIDURAL  Patient location during evaluation: Mother Baby Anesthesia Type: Epidural Level of consciousness: oriented and awake and alert Pain management: pain level controlled Vital Signs Assessment: post-procedure vital signs reviewed and stable Respiratory status: spontaneous breathing and respiratory function stable Cardiovascular status: blood pressure returned to baseline and stable Postop Assessment: no headache, no backache, no apparent nausea or vomiting and able to ambulate Anesthetic complications: no   No complications documented.   Last Vitals:  Vitals:   09/19/20 0751 09/19/20 1140  BP: 116/80 127/68  Pulse: 69 73  Resp: 20 18  Temp: 36.9 C 36.6 C  SpO2: 97%     Last Pain:  Vitals:   09/19/20 1140  TempSrc: Oral  PainSc:                  Starling Manns

## 2020-09-19 NOTE — Progress Notes (Signed)
Post Partum Day 1  Subjective: Doing well, no concerns. Ambulating without difficulty, pain managed with PO meds, tolerating regular diet, and voiding without difficulty.   No fever/chills, chest pain, shortness of breath, nausea/vomiting, or leg pain. No nipple or breast pain.   Objective: BP 116/80 (BP Location: Right Arm)   Pulse 69   Temp 98.4 F (36.9 C) (Oral)   Resp 20   Ht 5\' 4"  (1.626 m)   Wt 66.2 kg   LMP 12/06/2019   SpO2 97%   Breastfeeding Unknown   BMI 25.06 kg/m    Physical Exam:  General: alert and cooperative Breasts: soft/nontender CV: RRR Pulm: nl effort Abdomen: soft, non-tender Uterine Fundus: firm Incision: n/a Perineum: minimal edema, intact Lochia: appropriate DVT Evaluation: No evidence of DVT seen on physical exam. Edinburgh:  Edinburgh Postnatal Depression Scale Screening Tool 09/18/2020  I have been able to laugh and see the funny side of things. 0  I have looked forward with enjoyment to things. 0  I have blamed myself unnecessarily when things went wrong. 1  I have been anxious or worried for no good reason. 1  I have felt scared or panicky for no good reason. 0  Things have been getting on top of me. 0  I have been so unhappy that I have had difficulty sleeping. 0  I have felt sad or miserable. 1  I have been so unhappy that I have been crying. 0  The thought of harming myself has occurred to me. 0  Edinburgh Postnatal Depression Scale Total 3     Recent Labs    09/18/20 0559 09/19/20 0559  HGB 11.5* 9.9*  HCT 35.5* 31.0*  WBC 7.6 9.8  PLT 176 157    Assessment/Plan: 20 y.o. G2P2002 postpartum day # 1  -Continue routine postpartum care -Acute blood loss anemia - hemodynamically stable and asymptomatic; start PO ferrous sulfate BID with stool softeners  -Immunization status: Needs varicella  Disposition: Continue inpatient postpartum care Desires discharge home today   LOS: 1 day   Donterius Filley, CNM 09/19/2020, 8:11 AM

## 2020-09-19 NOTE — Discharge Instructions (Signed)
Postpartum Care After Vaginal Delivery The following information offers guidance about how to care for yourself from the time you deliver your baby to 6-12 weeks after delivery (postpartum period). If you have problems or questions, contact your health care provider for more specific instructions. Follow these instructions at home: Vaginal bleeding  It is normal to have vaginal bleeding (lochia) after delivery. Wear a sanitary pad for bleeding and discharge. ? During the first week after delivery, the amount and appearance of lochia is often similar to a menstrual period. ? Over the next few weeks, it will gradually decrease to a dry, yellow-brown discharge. ? For most women, lochia stops completely by 4-6 weeks after delivery, but can vary.  Change your sanitary pads frequently. Watch for any changes in your flow, such as: ? A sudden increase in volume. ? A change in color. ? Large blood clots.  If you pass a blood clot from your vagina, save it and call your health care provider. Do not flush blood clots down the toilet before talking with your health care provider.  Do not use tampons or douches until your health care provider approves.  If you are not breastfeeding, your period should return 6-8 weeks after delivery. If you are feeding your baby breast milk only, your period may not return until you stop breastfeeding. Perineal care  Keep the area between the vagina and the anus (perineum) clean and dry. Use medicated pads and pain-relieving sprays and creams as directed.  If you had a surgical cut in the perineum (episiotomy) or a tear, check the area for signs of infection until you are healed. Check for: ? More redness, swelling, or pain. ? Fluid or blood coming from the cut or tear. ? Warmth. ? Pus or a bad smell.  You may be given a squirt bottle to use instead of wiping to clean the perineum area after you use the bathroom. Pat the area gently to dry it.  To relieve pain  caused by an episiotomy, a tear, or swollen veins in the anus (hemorrhoids), take a warm sitz bath 2-3 times a day. In a sitz bath, the warm water should only come up to your hips and cover your buttocks.   Breast care  In the first few days after delivery, your breasts may feel heavy, full, and uncomfortable (breast engorgement). Milk may also leak from your breasts. Ask your health care provider about ways to help relieve the discomfort.  If you are breastfeeding: ? Wear a bra that supports your breasts and fits well. Use breast pads to absorb milk that leaks. ? Keep your nipples clean and dry. Apply creams and ointments as told. ? You may have uterine contractions every time you breastfeed for up to several weeks after delivery. This helps your uterus return to its normal size. ? If you have any problems with breastfeeding, notify your health care provider or lactation consultant.  If you are not breastfeeding: ? Avoid touching your breasts. Do not squeeze out (express) milk. Doing this can make your breasts produce more milk. ? Wear a good-fitting bra and use cold packs to help with swelling. Intimacy and sexuality  Ask your health care provider when you can engage in sexual activity. This may depend upon: ? Your risk of infection. ? How fast you are healing. ? Your comfort and desire to engage in sexual activity.  You are able to get pregnant after delivery, even if you have not had your period. Talk with   your health care provider about methods of birth control (contraception) or family planning if you desire future pregnancies. Medicines  Take over-the-counter and prescription medicines only as told by your health care provider.  Take an over-the-counter stool softener to help ease bowel movements as told by your health care provider.  If you were prescribed an antibiotic medicine, take it as told by your health care provider. Do not stop taking the antibiotic even if you start to  feel better.  Review all previous and current prescriptions to check for possible transfer into breast milk. Activity  Gradually return to your normal activities as told by your health care provider.  Rest as much as possible. Nap while your baby is sleeping. Eating and drinking  Drink enough fluid to keep your urine pale yellow.  To help prevent or relieve constipation, eat high-fiber foods every day.  Choose healthy eating to support breastfeeding or weight loss goals.  Take your prenatal vitamins until your health care provider tells you to stop.   General tips/recommendations  Do not use any products that contain nicotine or tobacco. These products include cigarettes, chewing tobacco, and vaping devices, such as e-cigarettes. If you need help quitting, ask your health care provider.  Do not drink alcohol, especially if you are breastfeeding.  Do not take medications or drugs that are not prescribed to you, especially if you are breastfeeding.  Visit your health care provider for a postpartum checkup within the first 3-6 weeks after delivery.  Complete a comprehensive postpartum visit no later than 12 weeks after delivery.  Keep all follow-up visits for you and your baby. Contact a health care provider if:  You feel unusually sad or worried.  Your breasts become red, painful, or hard.  You have a fever or other signs of an infection.  You have bleeding that is soaking through one pad an hour or you have blood clots.  You have a severe headache that doesn't go away or you have vision changes.  You have nausea and vomiting and are unable to eat or drink anything for 24 hours. Get help right away if:  You have chest pain or difficulty breathing.  You have sudden, severe leg pain.  You faint or have a seizure.  You have thoughts about hurting yourself or your baby. If you ever feel like you may hurt yourself or others, or have thoughts about taking your own life,  get help right away. Go to your nearest emergency department or:  Call your local emergency services (911 in the [place]  The National Suicide Prevention Lifeline at 1-800-273-8255. This suicide crisis helpline is open 24 hours a day.  Text the Crisis Text Line at 741741 (in the [place] Summary  The period of time after you deliver your newborn up to 6-12 weeks after delivery is called the postpartum period.  Keep all follow-up visits for you and your baby.  Review all previous and current prescriptions to check for possible transfer into breast milk.  Contact a health care provider if you feel unusually sad or worried during the postpartum period. This information is not intended to replace advice given to you by your health care provider. Make sure you discuss any questions you have with your health care provider. Document Revised: 02/18/2020 Document Reviewed: 02/18/2020 Elsevier Patient Education  2021 Elsevier Inc.   Postpartum Baby Blues The postpartum period begins right after the birth of a baby. During this time, there is often joy and excitement. It is   also a time of many changes in the life of the parents. A mother may feel happy one minute and sad or stressed the next. These feelings of sadness, called the baby blues, usually happen in the period right after the baby is born and go away within a week or two. What are the causes? The exact cause of this condition is not known. Changes in hormone levels after childbirth are believed to trigger some of the symptoms. Other factors that can play a role in these mood changes include:  Lack of sleep.  Stressful life events, such as financial problems, caring for a loved one, or death of a loved one.  Genetics. What are the signs or symptoms? Symptoms of this condition include:  Changes in mood, such as going from extreme happiness to sadness.  A decrease in concentration.  Difficulty sleeping.  Crying spells and  tearfulness.  Loss of appetite.  Irritability.  Anxiety. If these symptoms last for more than 2 weeks or become more severe, you may have postpartum depression. How is this diagnosed? This condition is diagnosed based on an evaluation of your symptoms. Your health care provider may use a screening tool that includes a list of questions to help identify a person with the baby blues or postpartum depression. How is this treated? The baby blues usually go away on their own in 1-2 weeks. Social support is often what is needed. You will be encouraged to get adequate sleep and rest. Follow these instructions at home: Lifestyle  Get as much rest as you can. Take a nap when the baby sleeps.  Exercise regularly as told by your health care provider. Some women find yoga and walking to be helpful.  Eat a balanced and nourishing diet. This includes plenty of fruits and vegetables, whole grains, and lean proteins.  Do little things that you enjoy. Take a bubble bath, read your favorite magazine, or listen to your favorite music.  Avoid alcohol.  Ask for help with household chores, cooking, grocery shopping, or running errands. Do not try to do everything yourself. Consider hiring a postpartum doula to help. This is a professional who specializes in providing support to new mothers.  Try not to make any major life changes during pregnancy or right after giving birth. This can add stress.      General instructions  Talk to people close to you about how you are feeling. Get support from your partner, family members, friends, or other new moms. You may want to join a support group.  Find ways to manage stress. This may include: ? Writing your thoughts and feelings in a journal. ? Spending time outside. ? Spending time with people who make you laugh.  Try to stay positive in how you think. Think about the things you are grateful for.  Take over-the-counter and prescription medicines only as  told by your health care provider.  Let your health care provider know if you have any concerns.  Keep all postpartum visits. This is important. Contact a health care provider if:  Your baby blues do not go away after 2 weeks. Get help right away if:  You have thoughts of taking your own life (suicidal thoughts), or of harming your baby or someone else.  You see or hear things that are not there (hallucinations). If you ever feel like you may hurt yourself or others, or have thoughts about taking your own life, get help right away. Go to your nearest emergency department or:    Call your local emergency services (911 in the [place]  Call a suicide crisis helpline, such as the National Suicide Prevention Lifeline, at 970-770-4524. This is open 24 hours a day in the [place]  Text the Crisis Text Line at 817-378-2373 (in the [place] Summary  After giving birth, you may feel happy one minute and sad or stressed the next. Feelings of sadness that happen right after the baby is born and go away after a week or two are called the baby blues.  You can manage the baby blues by getting enough rest, eating a healthy diet, exercising, spending time with supportive people, and finding ways to manage stress.  If feelings of sadness and stress last longer than 2 weeks or get in the way of caring for your baby, talk with your health care provider. This may mean you have postpartum depression. This information is not intended to replace advice given to you by your health care provider. Make sure you discuss any questions you have with your health care provider. Document Revised: 11/27/2019 Document Reviewed: 11/27/2019 Elsevier Patient Education  2021 Elsevier Inc.   [email or web address]">  Contraceptive Injection Information A contraceptive injection is a shot that prevents pregnancy. It is also called a birth control shot. It is effective for 3 months  (12-13 weeks). How does the injection work? The medicine progestin is injected into your body under the skin (subcutaneous) or into a muscle (intramuscular), usually in the upper arm or buttock. Progestin has similar effects as the hormone progesterone, which helps prepare the body for pregnancy and controls the monthly menstrual cycle. The progestin injection prevents pregnancy by:  Stopping the ovaries from releasing eggs.  Thickening cervical mucus to prevent sperm from entering the cervix.  Thinning the lining of the uterus to prevent a fertilized egg from attaching to the uterus. What are the advantages of this form of birth control? Some advantages of this form of birth control are:  It is highly effective at preventing pregnancy when used correctly.  You do not need to do a daily action such as taking a pill.  It can slow down the flow of heavy menstrual periods.  It can control cramps and painful menstrual periods.  It may temporarily stop your menstrual periods (amenorrhea).  It lowers your risk for developing cancer of the uterus and pelvic inflammatory disease (PID). What are the disadvantages of this form of birth control? Some disadvantages of this form of birth control are:  It can be associated with side effects, such as: ? Weight gain. ? Spotting or bleeding between menstrual periods. ? Breast tenderness. ? Headaches. ? Discomfort in the abdomen. ? Nervousness. ? Loss of bone density.  It does not protect against sexually transmitted infections (STIs).  You must visit your health care provider every 3 months (12-13 weeks) to receive the injection.  The injections may be uncomfortable.  It can take you up to a year to become pregnant after you stop the injections. Am I a good candidate for these injections? Your health care provider can help you determine whether you are a good candidate for contraceptive injections. Make sure to discuss the possible side  effects with your health care provider. You should not get the shot if you have a history of:  Breast cancer.  Heart attack or stroke.  Liver disease.  Liver cancer.  Unexplained vaginal bleeding. If you have any of the following conditions, talk with your health care provider to see if a contraceptive implant is  right for you:  More than one risk factor for heart disease, such as high blood pressure, high cholesterol, diabetes, or cigarette smoking.  History of blood clots.  Risk factors for bone loss (osteoporosis).   Follow these instructions at home: General instructions  Take over-the-counter and prescription medicines only as told by your health care provider.  Do not use any products that contain nicotine or tobacco. These products include cigarettes, chewing tobacco, and vaping devices, such as e-cigarettes. If you need help quitting, ask your health care provider.  Do not rub or massage the injection site.  Track your menstrual periods so you will know if they become irregular.  Always use a condom to protect against STIs.  Eat foods that are high in calcium and vitamin D, such as milk, cheese, and salmon. Doing this may help with any loss in bone density caused by the contraceptive injection. Ask your health care provider for dietary recommendations.  Keep all follow-up visits. This is important. Summary  A contraceptive injection is a shot that prevents pregnancy. It is effective for 3 months (12-13 weeks).  This injection prevents pregnancy by thickening the cervical mucus, thinning the uterine wall, and stopping the ovaries from releasing eggs.  This type of birth control is highly effective at preventing pregnancy. It can also slow down the flow of menstrual periods or stop them temporarily.  Side effects of this injection can include weight gain, headaches, bleeding between menstrual periods, nervousness, and loss of bone density.  Your health care provider  can help you determine whether you are a good candidate for contraceptive injections. This information is not intended to replace advice given to you by your health care provider. Make sure you discuss any questions you have with your health care provider. Document Revised: 12/14/2019 Document Reviewed: 12/14/2019 Elsevier Patient Education  2021 [employer].

## 2020-09-20 ENCOUNTER — Encounter: Payer: Self-pay | Admitting: Obstetrics and Gynecology

## 2020-09-20 ENCOUNTER — Observation Stay
Admission: EM | Admit: 2020-09-20 | Discharge: 2020-09-20 | Disposition: A | Discharge status: Home / Self Care | Payer: Medicaid Other | Attending: Obstetrics and Gynecology | Admitting: Obstetrics and Gynecology

## 2020-09-20 ENCOUNTER — Other Ambulatory Visit: Payer: Self-pay

## 2020-09-20 DIAGNOSIS — R2243 Localized swelling, mass and lump, lower limb, bilateral: Secondary | ICD-10-CM | POA: Insufficient documentation

## 2020-09-20 DIAGNOSIS — O99893 Other specified diseases and conditions complicating puerperium: Principal | ICD-10-CM | POA: Insufficient documentation

## 2020-09-20 DIAGNOSIS — O1205 Gestational edema, complicating the puerperium: Secondary | ICD-10-CM | POA: Diagnosis present

## 2020-09-20 DIAGNOSIS — R519 Headache, unspecified: Secondary | ICD-10-CM | POA: Insufficient documentation

## 2020-09-20 LAB — COMPREHENSIVE METABOLIC PANEL
ALT: 12 U/L (ref 0–44)
AST: 22 U/L (ref 15–41)
Albumin: 3.1 g/dL — ABNORMAL LOW (ref 3.5–5.0)
Alkaline Phosphatase: 163 U/L — ABNORMAL HIGH (ref 38–126)
Anion gap: 8 (ref 5–15)
BUN: 5 mg/dL — ABNORMAL LOW (ref 6–20)
CO2: 23 mmol/L (ref 22–32)
Calcium: 8.6 mg/dL — ABNORMAL LOW (ref 8.9–10.3)
Chloride: 108 mmol/L (ref 98–111)
Creatinine, Ser: 0.48 mg/dL (ref 0.44–1.00)
GFR, Estimated: >60 mL/min (ref >60)
Glucose, Bld: 100 mg/dL — ABNORMAL HIGH (ref 70–99)
Potassium: 3.3 mmol/L — ABNORMAL LOW (ref 3.5–5.1)
Sodium: 139 mmol/L (ref 135–145)
Total Bilirubin: 0.5 mg/dL (ref 0.3–1.2)
Total Protein: 6.1 g/dL — ABNORMAL LOW (ref 6.5–8.1)

## 2020-09-20 LAB — CBC
HCT: 31.8 % — ABNORMAL LOW (ref 36.0–46.0)
Hemoglobin: 10.2 g/dL — ABNORMAL LOW (ref 12.0–15.0)
MCH: 25.9 pg — ABNORMAL LOW (ref 26.0–34.0)
MCHC: 32.1 g/dL (ref 30.0–36.0)
MCV: 80.7 fL (ref 80.0–100.0)
Platelets: 165 10*3/uL (ref 150–400)
RBC: 3.94 MIL/uL (ref 3.87–5.11)
RDW: 16.6 % — ABNORMAL HIGH (ref 11.5–15.5)
WBC: 8.3 10*3/uL (ref 4.0–10.5)
nRBC: 0 % (ref 0.0–0.2)

## 2020-09-20 LAB — PROTEIN / CREATININE RATIO, URINE
Creatinine, Urine: 100 mg/dL
Protein Creatinine Ratio: 0.13 mg/mg{Cre} (ref 0.00–0.15)
Total Protein, Urine: 13 mg/dL

## 2020-09-20 MED ORDER — IBUPROFEN 600 MG PO TABS
600.0000 mg | ORAL_TABLET | Freq: Four times a day (QID) | ORAL | Status: DC | PRN
  Administered 2020-09-20: 600 mg via ORAL
  Filled 2020-09-20: qty 1

## 2020-09-20 MED ORDER — ACETAMINOPHEN 500 MG PO TABS
1000.0000 mg | ORAL_TABLET | Freq: Four times a day (QID) | ORAL | Status: DC | PRN
  Administered 2020-09-20: 1000 mg via ORAL
  Filled 2020-09-20: qty 2

## 2020-09-20 NOTE — Discharge Summary (Signed)
Carolyn Thompson is a 20 y.o. female. She is postpartum day 2 after vaginal delivery.  Patient's last menstrual period was 12/06/2019. Estimated Date of Delivery: 09/15/20  Prenatal care site: [facility]  Current pregnancy complicated by:   PP day 2  1. Transfer in at 35wks, last visit 20wks in [place].  2. Elevated 1hr GTT at 35wks: 151  3hr GTT: 08/15/20: [id number] 3. Iron deficiency Anemia: hgb 10.8 at 35wks  Start twice daily iron supplement- 08/12/20 4. Teen pregnancy, age 71yo 5. 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 Varicella Non-Immune  Advise vaccine postpartum  Chief complaint: HA on arrival, swelling in legs, concerned due to elevated BP on 4/3 while  in labor. Arms "tingling", not wearing compression hose bc her grandmother told her they cause blood clots.    S: Resting comfortably. HA resolved after ibuprofen/tylenol administered. Denies: visual changes, SOB, or RUQ/epigastric pain  Maternal Medical History:   Past Medical History:  Diagnosis Date  . Anemia     Past Surgical History:  Procedure Laterality Date  . TONSILLECTOMY     Age 56     No Known Allergies  Prior to Admission medications   Medication Sig Start Date End Date Taking? Authorizing Provider  ferrous sulfate 325 (65 FE) MG EC tablet Take 325 mg by mouth 3 (three) times daily with meals.   Yes [provider]  ibuprofen (ADVIL) 600 MG tablet Take 600 mg by mouth every 6 (six) hours as needed.   Yes [provider]  Prenatal Vit-Fe Fumarate-FA (MULTIVITAMIN-PRENATAL) 27-0.8 MG TABS tablet Take 1 tablet by mouth daily at 12 noon.   Yes [provider]      Social History: She  reports that she has never smoked. She has never used smokeless tobacco. She reports previous alcohol use. She reports previous drug use.  Family History:  no history of gyn cancers  Review of Systems: A full review of systems was performed and negative except as noted in the HPI.      O:  BP 126/77   Pulse 67   Temp 98.6 F (37 C) (Oral)   Resp 16   Ht 5\' 4"  (1.626 m)   LMP 12/06/2019   BMI 25.06 kg/m  Results for orders placed or performed during the hospital encounter of 09/20/20 (from the past 48 hour(s))  Comprehensive metabolic panel   Collection Time: 09/20/20  3:35 PM  Result Value Ref Range   Sodium 139 135 - 145 mmol/L   Potassium 3.3 (L) 3.5 - 5.1 mmol/L   Chloride 108 98 - 111 mmol/L   CO2 23 22 - 32 mmol/L   Glucose, Bld 100 (H) 70 - 99 mg/dL   BUN 5 (L) 6 - 20 mg/dL   Creatinine, Ser 11/20/20 0.44 - 1.00 mg/dL   Calcium 8.6 (L) 8.9 - 10.3 mg/dL   Total Protein 6.1 (L) 6.5 - 8.1 g/dL   Albumin 3.1 (L) 3.5 - 5.0 g/dL   AST 22 15 - 41 U/L   ALT 12 0 - 44 U/L   Alkaline Phosphatase 163 (H) 38 - 126 U/L   Total Bilirubin 0.5 0.3 - 1.2 mg/dL   GFR, Estimated 7.89 >38 mL/min   Anion gap 8 5 - 15  CBC on admission   Collection Time: 09/20/20  3:35 PM  Result Value Ref Range   WBC 8.3 4.0 - 10.5 K/uL   RBC 3.94 3.87 - 5.11 MIL/uL   Hemoglobin 10.2 (L) 12.0 -  15.0 g/dL   HCT 02.5 (L) 85.2 - 77.8 %   MCV 80.7 80.0 - 100.0 fL   MCH 25.9 (L) 26.0 - 34.0 pg   MCHC 32.1 30.0 - 36.0 g/dL   RDW 24.2 (H) 35.3 - 61.4 %   Platelets 165 150 - 400 K/uL   nRBC 0.0 0.0 - 0.2 %  Protein / creatinine ratio, urine   Collection Time: 09/20/20  3:48 PM  Result Value Ref Range   Creatinine, Urine 100 mg/dL   Total Protein, Urine 13 mg/dL   Protein Creatinine Ratio 0.13 0.00 - 0.15 mg/mg[Cre]  Results for orders placed or performed during the hospital encounter of 09/18/20 (from the past 48 hour(s))  CBC   Collection Time: 09/19/20  5:59 AM  Result Value Ref Range   WBC 9.8 4.0 - 10.5 K/uL   RBC 3.86 (L) 3.87 - 5.11 MIL/uL   Hemoglobin 9.9 (L) 12.0 - 15.0 g/dL   HCT 43.1 (L) 54.0 - 08.6 %   MCV 80.3 80.0 - 100.0 fL   MCH 25.6 (L) 26.0 - 34.0 pg   MCHC 31.9 30.0 - 36.0 g/dL   RDW 76.1 (H) 95.0 - 93.2 %   Platelets 157 150 - 400 K/uL   nRBC 0.0 0.0 - 0.2  %     Constitutional: NAD, AAOx3  HE/ENT: extraocular movements grossly intact, moist mucous membranes CV: RRR PULM: nl respiratory effort, CTABL     Abd: non-tender, non-distended, soft, fundus 2 below umbilicus.  Ext: Non-tender, slight pedal edema   Psych: mood appropriate, speech normal Pelvic: deferred   A/P: 20 y.o. PPD2 s/p SVD here for c/o increased swelling, headache and arm tingling  Principle Diagnosis: Headache, edema   No e/o Postpartum preeclampsia  Normotensive, Labs reviewed, WNL  D/c home stable, precautions reviewed, follow-up as scheduled.    Randa Ngo, CNM 09/20/2020  5:05 PM

## 2020-09-20 NOTE — Progress Notes (Signed)
Patient discharged home with infant. Discharge instructions, prescriptions and follow up appointment given to and reviewed with patient. Patient verbalized understanding. Pt wheeled out with infant

## 2020-09-20 NOTE — OB Triage Note (Signed)
CNM and RN reviewed discharge instructions with patient. Patient and support person verbalized understanding. All questions answered and no other concerns at this time.

## 2020-09-20 NOTE — OB Triage Note (Signed)
Patient postpartum day 2 states that she was discharged home this morning but started having a headache 1 hour after she left. She states that she currently has a headache that she rates a 2/10 on the pain scale. She reports seeing floaters today but currently has no floaters. She states she has swelling in her legs. On RN assessment, there is no pitting edema and bilateral lower extremities are WNL. RN assessed +2 reflexes and absent clonus. Initial BP was 135/82. Support person and baby at bedside with patient. VSS.

## 2020-09-20 NOTE — OB Triage Note (Signed)
Pt discharged in stable condition per CNM. Discharge instructions reviewed with pt- pt has no questions or concerns at this time. Will follow-up with office.

## 2022-06-18 NOTE — L&D Delivery Note (Signed)
Delivery Note  Carolyn Thompson is a [id number] at [redacted]w[redacted]d, Patient's last menstrual period was 07/03/2022 (exact date)., consistent with [place] at [redacted]w[redacted]d. Estimated Date of Delivery: 04/09/23   First Stage: Labor onset: 1900 Augmentation: AROM Analgesia Eliezer Lofts intrapartum: Epidural AROM at 0617 GBS: positive  IP Antibiotics: penicillin x 3  Second Stage: Complete dilation at 0834 Onset of pushing at 0836 FHR second stage 125 bpm with moderate variability, variable decels with pushing   Carolyn Thompson presented to L&D with early labor and GBS positive status. She was initially expectantly managed.  AROM was done for augmentation. She progressed well to C/C/+2 with an urge to push.  She pushed effectively over approximately 10 minutes for a spontaneous vaginal birth.  Delivery of a viable baby boy on 04/06/2023 at 0843 by CNM Delivery of fetal head in OA position with restitution to LOT. No nuchal cord;  Anterior then posterior shoulders delivered easily with gentle downward traction. Baby placed on mom's chest, and attended to by baby RN Cord double clamped after cessation of pulsation, cut by father of baby.  Cord blood sample collection: Not Indicated B POS   Third Stage: Oxytocin bolus started after delivery of infant for hemorrhage prophylaxis  Placenta delivered via Tomasa Blase mechanism intact with 3 VC @ 0901 Placenta disposition: discarded Uterine tone firm / bleeding moderate   Laceration: none Anesthesia for repair: N/A  Suture:  N/A Est. Blood Loss (mL): 200 ml   Complications: None  Mom to postpartum.  Baby to Couplet care / Skin to Skin.  Newborn: Information for the patient's newborn:  Loranda, Methner [[id number]]  Live born female "[name]" Birth Weight:  pending  APGAR: 8, 9  Newborn Delivery   Birth date/time: 04/06/2023 08:43:59 Delivery type: Vaginal, Spontaneous      Feeding planned: formula feeding  ---------- Margaretmary Eddy, CNM Certified Nurse  Midwife [place]  [facility]

## 2022-08-01 ENCOUNTER — Ambulatory Visit (LOCAL_COMMUNITY_HEALTH_CENTER): Payer: Self-pay

## 2022-08-01 VITALS — BP 129/80 | Ht 64.0 in | Wt 131.5 lb

## 2022-08-01 DIAGNOSIS — Z3201 Encounter for pregnancy test, result positive: Secondary | ICD-10-CM | POA: Diagnosis not present

## 2022-08-01 LAB — PREGNANCY, URINE: Preg Test, Ur: POSITIVE — AB

## 2022-08-01 MED ORDER — PRENATAL 27-0.8 MG PO TABS: 1.0000 | ORAL_TABLET | Freq: Every day | ORAL | 0 refills | Status: AC

## 2022-08-01 NOTE — Progress Notes (Signed)
UPT positive. Plans prenatal care at [facility]. Positive preg packet given.   The patient was dispensed prenatal vitamins #100 today per SO Dr Vertell Novak. I provided counseling today regarding the medication. We discussed the medication, the side effects and when to call clinic. Patient given the opportunity to ask questions. Questions answered.   Sent to DSS for medicaid/preg women.  Josie Saunders, RN

## 2022-08-22 ENCOUNTER — Encounter: Payer: Self-pay | Admitting: Emergency Medicine

## 2022-08-22 ENCOUNTER — Other Ambulatory Visit: Payer: Self-pay

## 2022-08-22 ENCOUNTER — Emergency Department
Admission: EM | Admit: 2022-08-22 | Discharge: 2022-08-22 | Disposition: A | Discharge status: Home / Self Care | Payer: Medicaid Other | Attending: Emergency Medicine | Admitting: Emergency Medicine

## 2022-08-22 DIAGNOSIS — O99891 Other specified diseases and conditions complicating pregnancy: Secondary | ICD-10-CM | POA: Diagnosis not present

## 2022-08-22 DIAGNOSIS — G43109 Migraine with aura, not intractable, without status migrainosus: Secondary | ICD-10-CM | POA: Diagnosis not present

## 2022-08-22 NOTE — ED Provider Notes (Signed)
[facility] Provider Note    Event Date/Time   First MD Initiated Contact with Patient 08/22/22 1818     (approximate)   History   Headache   HPI  Carolyn Thompson is a 22 y.o. female reports no major past medical history except she is recently learned that she is [redacted] weeks pregnant   Yesterday she developed brief period of about 5 to 10 minutes of seeing strange flashing lights in her vision.  After that the strange lights went away and she had a throbbing headache over her right frontal scalp above the area of her right eye.  That headache has come and gone.  She took a Tylenol tablet and reports that helped quite a bit.  Currently her headache is gone.  Earlier today she also had a very brief episode where her left arm felt a little tingly just from her elbow down to her wrist and that also went away  She relates that she is about [redacted] weeks pregnant.  No fevers no chills no other symptoms.  Her children who are with her have had fevers this week but she has not felt ill.  There is no nausea no vomiting and at this point all of her symptoms are gone  Physical Exam   Triage Vital Signs: ED Triage Vitals  Enc Vitals Group     BP 08/22/22 1743 (!) 125/96     Pulse Rate 08/22/22 1743 92     Resp 08/22/22 1743 18     Temp 08/22/22 1743 97.8 F (36.6 C)     Temp Source 08/22/22 1743 Oral     SpO2 08/22/22 1743 100 %     Weight --      Height --      Head Circumference --      Peak Flow --      Pain Score 08/22/22 1744 4     Pain Loc --      Pain Edu? --      Excl. in [place]? --     Most recent vital signs: Vitals:   08/22/22 1743  BP: (!) 125/96  Pulse: 92  Resp: 18  Temp: 97.8 F (36.6 C)  SpO2: 100%     General: Awake, no distress.  Fully oriented.  Holding her child, standing walking about the room without difficulty CV:  Good peripheral perfusion.  Normal tones and rate Resp:  Normal effort.  Clear bilaterally Abd:  No distention.   Other:  5 out of 5 strength in all extremities.  Ambulates with stable normal gait.  Fully alert oriented.  Normal facial movements.  No obvious sinus tenderness.  Extraocular movements are normal pupils equal round reactive.  At present time patient reports no headache or ongoing symptoms   ED Results / Procedures / Treatments   Labs (all labs ordered are listed, but only abnormal results are displayed) Labs Reviewed - No data to display   EKG     RADIOLOGY     PROCEDURES:  Critical Care performed: No  Procedures   MEDICATIONS ORDERED IN ED: Medications - No data to display   IMPRESSION / MDM / [place] / ED COURSE  I reviewed the triage vital signs and the nursing notes.                              Differential diagnosis includes, but is not limited to, migraine,  migraine with aura, tension headache, sinus disease, sinusitis etc.  No clear evidence of any infection.  No meningismus normal and reassuring neurologic exam and headache is abated.  She describes symptoms that are pathopneumonic for migraine headache with very classical symptoms.  She is aware that she is [redacted] weeks pregnant she is not taking anything other than a prenatal vitamin and it seems Tylenol has worked to abort her headache.  I counseled her on careful return precautions and recommended follow-up with primary care.  At this time I do not see evidence to support need for imaging.  She is awake alert headache completely gone.  Highly unlikely this would represent development of acute concerns such as adenoma etc. and at this juncture given the her headache is gone away recommend conservative management and appropriate follow-up prescriptions with the patient agreeable with  Patient's presentation is most consistent with acute, uncomplicated illness.          FINAL CLINICAL IMPRESSION(S) / ED DIAGNOSES   Final diagnoses:  Migraine with aura and without status migrainosus, not  intractable     Rx / DC Orders   ED Discharge Orders     None        Note:  This document was prepared using Dragon voice recognition software and may include unintentional dictation errors.   Delman Kitten, MD 08/22/22 Lurena Nida

## 2022-08-22 NOTE — ED Triage Notes (Signed)
Patient to ED for headache x2 days. Also, states earlier today had left lower arm tingling that has since gone away. Also had a pain above right eyebrow that has also gone away.

## 2022-09-11 DIAGNOSIS — Z3480 Encounter for supervision of other normal pregnancy, unspecified trimester: Secondary | ICD-10-CM | POA: Insufficient documentation

## 2022-10-04 ENCOUNTER — Emergency Department
Admission: EM | Admit: 2022-10-04 | Discharge: 2022-10-04 | Disposition: A | Discharge status: Home / Self Care | Payer: Medicaid Other | Attending: Emergency Medicine | Admitting: Emergency Medicine

## 2022-10-04 ENCOUNTER — Other Ambulatory Visit: Payer: Self-pay

## 2022-10-04 DIAGNOSIS — B354 Tinea corporis: Secondary | ICD-10-CM | POA: Insufficient documentation

## 2022-10-04 MED ORDER — CLOTRIMAZOLE 1 % EX CREA: 1.0000 | TOPICAL_CREAM | Freq: Two times a day (BID) | CUTANEOUS | 0 refills | Status: DC

## 2022-10-04 NOTE — ED Triage Notes (Signed)
Pt comes with c/o spot to left side of neck. Pt stats this started 2 weeks ago. Pt unsure if it is ringworm

## 2022-10-04 NOTE — ED Provider Notes (Signed)
[facility] Provider Note    Event Date/Time   First MD Initiated Contact with Patient 10/04/22 1358     (approximate)   History   Tinea   HPI  Carolyn Thompson is a 22 y.o. female   presents to the ED with complaint of a spot on the left side of her neck that started 2 weeks ago.  Patient states her daughter recently was treated for ringworm and believes this to be the same.  No over-the-counter medications have been used in this area.  Patient states that she is approximately [redacted] weeks pregnant.   Patient is here with her 2 children who are being seen for pinworms/roundworms.   Physical Exam   Triage Vital Signs: ED Triage Vitals  Enc Vitals Group     BP 10/04/22 1308 129/79     Pulse Rate 10/04/22 1308 96     Resp 10/04/22 1308 16     Temp 10/04/22 1308 98.2 F (36.8 C)     Temp Source 10/04/22 1308 Oral     SpO2 10/04/22 1308 97 %     Weight --      Height --      Head Circumference --      Peak Flow --      Pain Score 10/04/22 1306 0     Pain Loc --      Pain Edu? --      Excl. in [place]? --     Most recent vital signs: Vitals:   10/04/22 1308  BP: 129/79  Pulse: 96  Resp: 16  Temp: 98.2 F (36.8 C)  SpO2: 97%     General: Awake, no distress.  CV:  Good peripheral perfusion.  Resp:  Normal effort.  Abd:  No distention.  Other:  Left lateral neck base has a small erythematous area with the appearance of clearing in the center.  No drainage, pustules or vesicles are noted.   ED Results / Procedures / Treatments   Labs (all labs ordered are listed, but only abnormal results are displayed) Labs Reviewed - No data to display    PROCEDURES:  Critical Care performed:   Procedures   MEDICATIONS ORDERED IN ED: Medications - No data to display   IMPRESSION / MDM / ASSESSMENT AND PLAN / ED COURSE  I reviewed the triage vital signs and the nursing notes.   Differential diagnosis includes, but is not limited to,  nonspecific rash, tinea corporis, contact dermatitis.  22 year old female presents to the ED with complaint of a ringworm on the left side of her neck.  She states her daughter had been treated for ringworm recently and her rash looks similar to the way her daughter started out.  A cream was sent to the pharmacy for her to begin using and she is aware that she is to use a small amount twice a day.  Patient also brought to children in with her to be seen for intestinal pinworms.  Children sometimes sleep with her but patient has no symptoms.  She was told to follow-up with her OB/GYN as medication is not advised for pregnancy.    Patient's presentation is most consistent with acute, uncomplicated illness.  FINAL CLINICAL IMPRESSION(S) / ED DIAGNOSES   Final diagnoses:  Tinea corporis     Rx / DC Orders   ED Discharge Orders          Ordered    clotrimazole (LOTRIMIN) 1 % cream  2 times  daily        10/04/22 1443             Note:  This document was prepared using Dragon voice recognition software and may include unintentional dictation errors.   Tommi Rumps, PA-C 10/04/22 1456    Jene Every, MD 10/04/22 1524

## 2022-10-04 NOTE — Discharge Instructions (Signed)
With [facility] make an manage dermatology if any continued problems with ringworm.  A prescription for cream was sent to the pharmacy for you to begin using a small amount to the area twice a day.

## 2022-11-14 DIAGNOSIS — O280 Abnormal hematological finding on antenatal screening of mother: Secondary | ICD-10-CM | POA: Diagnosis present

## 2023-04-02 DIAGNOSIS — B951 Streptococcus, group B, as the cause of diseases classified elsewhere: Secondary | ICD-10-CM | POA: Diagnosis present

## 2023-04-05 ENCOUNTER — Inpatient Hospital Stay
Admission: EM | Admit: 2023-04-05 | Discharge: 2023-04-07 | DRG: 798 | Disposition: A | Discharge status: Home / Self Care | Payer: Medicaid Other

## 2023-04-05 ENCOUNTER — Encounter: Payer: Self-pay | Admitting: Obstetrics and Gynecology

## 2023-04-05 ENCOUNTER — Other Ambulatory Visit: Payer: Self-pay

## 2023-04-05 DIAGNOSIS — O9902 Anemia complicating childbirth: Secondary | ICD-10-CM | POA: Diagnosis present

## 2023-04-05 DIAGNOSIS — Z302 Encounter for sterilization: Secondary | ICD-10-CM | POA: Diagnosis not present

## 2023-04-05 DIAGNOSIS — Z2839 Other underimmunization status: Secondary | ICD-10-CM

## 2023-04-05 DIAGNOSIS — Z91013 Allergy to seafood: Secondary | ICD-10-CM

## 2023-04-05 DIAGNOSIS — F17290 Nicotine dependence, other tobacco product, uncomplicated: Secondary | ICD-10-CM | POA: Diagnosis present

## 2023-04-05 DIAGNOSIS — R772 Abnormality of alphafetoprotein: Secondary | ICD-10-CM | POA: Diagnosis present

## 2023-04-05 DIAGNOSIS — O479 False labor, unspecified: Secondary | ICD-10-CM | POA: Diagnosis present

## 2023-04-05 DIAGNOSIS — O99334 Smoking (tobacco) complicating childbirth: Secondary | ICD-10-CM | POA: Diagnosis present

## 2023-04-05 DIAGNOSIS — O99824 Streptococcus B carrier state complicating childbirth: Principal | ICD-10-CM | POA: Diagnosis present

## 2023-04-05 DIAGNOSIS — Z3A39 39 weeks gestation of pregnancy: Secondary | ICD-10-CM | POA: Diagnosis not present

## 2023-04-05 DIAGNOSIS — O26893 Other specified pregnancy related conditions, third trimester: Secondary | ICD-10-CM | POA: Diagnosis present

## 2023-04-05 DIAGNOSIS — O09899 Supervision of other high risk pregnancies, unspecified trimester: Secondary | ICD-10-CM

## 2023-04-05 DIAGNOSIS — O280 Abnormal hematological finding on antenatal screening of mother: Secondary | ICD-10-CM | POA: Diagnosis present

## 2023-04-05 DIAGNOSIS — B951 Streptococcus, group B, as the cause of diseases classified elsewhere: Secondary | ICD-10-CM | POA: Diagnosis present

## 2023-04-05 LAB — CBC
HCT: 36.4 % (ref 36.0–46.0)
Hemoglobin: 11.9 g/dL — ABNORMAL LOW (ref 12.0–15.0)
MCH: 24.8 pg — ABNORMAL LOW (ref 26.0–34.0)
MCHC: 32.7 g/dL (ref 30.0–36.0)
MCV: 75.8 fL — ABNORMAL LOW (ref 80.0–100.0)
Platelets: 220 10*3/uL (ref 150–400)
RBC: 4.8 MIL/uL (ref 3.87–5.11)
RDW: 19.9 % — ABNORMAL HIGH (ref 11.5–15.5)
WBC: 14.2 10*3/uL — ABNORMAL HIGH (ref 4.0–10.5)
nRBC: 0 % (ref 0.0–0.2)

## 2023-04-05 LAB — TYPE AND SCREEN

## 2023-04-05 LAB — RUPTURE OF MEMBRANE (ROM)PLUS: Rom Plus: NEGATIVE

## 2023-04-05 MED ORDER — LACTATED RINGERS IV SOLN
500.0000 mL | Freq: Once | INTRAVENOUS | Status: AC
  Administered 2023-04-06: 500 mL via INTRAVENOUS

## 2023-04-05 MED ORDER — FENTANYL-BUPIVACAINE-NACL 0.5-0.125-0.9 MG/250ML-% EP SOLN
12.0000 mL/h | EPIDURAL | Status: DC | PRN
  Administered 2023-04-06: 12 mL/h via EPIDURAL
  Filled 2023-04-05: qty 250

## 2023-04-05 MED ORDER — ACETAMINOPHEN 500 MG PO TABS: 1000.0000 mg | ORAL_TABLET | Freq: Four times a day (QID) | ORAL | Status: DC | PRN

## 2023-04-05 MED ORDER — LACTATED RINGERS IV SOLN: INTRAVENOUS | Status: DC

## 2023-04-05 MED ORDER — OXYTOCIN BOLUS FROM INFUSION
333.0000 mL | Freq: Once | INTRAVENOUS | Status: AC
  Administered 2023-04-06: 333 mL via INTRAVENOUS

## 2023-04-05 MED ORDER — SODIUM CHLORIDE 0.9% FLUSH: 3.0000 mL | INTRAVENOUS | Status: DC | PRN

## 2023-04-05 MED ORDER — LIDOCAINE HCL (PF) 1 % IJ SOLN: 30.0000 mL | INTRAMUSCULAR | Status: DC | PRN

## 2023-04-05 MED ORDER — SODIUM CHLORIDE 0.9% FLUSH: 3.0000 mL | Freq: Two times a day (BID) | INTRAVENOUS | Status: DC

## 2023-04-05 MED ORDER — LACTATED RINGERS IV SOLN
500.0000 mL | INTRAVENOUS | Status: DC | PRN
  Administered 2023-04-06: 500 mL via INTRAVENOUS

## 2023-04-05 MED ORDER — LIDOCAINE HCL (PF) 1 % IJ SOLN
INTRAMUSCULAR | Status: AC
  Filled 2023-04-05: qty 30

## 2023-04-05 MED ORDER — FENTANYL CITRATE (PF) 100 MCG/2ML IJ SOLN: 50.0000 ug | INTRAMUSCULAR | Status: DC | PRN

## 2023-04-05 MED ORDER — CALCIUM CARBONATE ANTACID 500 MG PO CHEW: 2.0000 | CHEWABLE_TABLET | ORAL | Status: DC | PRN

## 2023-04-05 MED ORDER — OXYTOCIN-SODIUM CHLORIDE 30-0.9 UT/500ML-% IV SOLN
2.5000 [IU]/h | INTRAVENOUS | Status: DC
  Administered 2023-04-06: 2.5 [IU]/h via INTRAVENOUS

## 2023-04-05 MED ORDER — SOD CITRATE-CITRIC ACID 500-334 MG/5ML PO SOLN: 30.0000 mL | ORAL | Status: DC | PRN

## 2023-04-05 MED ORDER — ONDANSETRON HCL 4 MG/2ML IJ SOLN: 4.0000 mg | Freq: Four times a day (QID) | INTRAMUSCULAR | Status: DC | PRN

## 2023-04-05 MED ORDER — OXYTOCIN 10 UNIT/ML IJ SOLN
INTRAMUSCULAR | Status: AC
  Filled 2023-04-05: qty 2

## 2023-04-05 MED ORDER — DIPHENHYDRAMINE HCL 50 MG/ML IJ SOLN: 12.5000 mg | INTRAMUSCULAR | Status: DC | PRN

## 2023-04-05 MED ORDER — OXYTOCIN-SODIUM CHLORIDE 30-0.9 UT/500ML-% IV SOLN
INTRAVENOUS | Status: AC
  Filled 2023-04-05: qty 500

## 2023-04-05 MED ORDER — PHENYLEPHRINE 80 MCG/ML (10ML) SYRINGE FOR IV PUSH (FOR BLOOD PRESSURE SUPPORT): 80.0000 ug | PREFILLED_SYRINGE | INTRAVENOUS | Status: DC | PRN

## 2023-04-05 MED ORDER — MISOPROSTOL 200 MCG PO TABS
ORAL_TABLET | ORAL | Status: AC
  Filled 2023-04-05: qty 4

## 2023-04-05 MED ORDER — SODIUM CHLORIDE 0.9 % IV SOLN: 250.0000 mL | INTRAVENOUS | Status: DC | PRN

## 2023-04-05 MED ORDER — SODIUM CHLORIDE 0.9 % IV SOLN
5.0000 10*6.[IU] | Freq: Once | INTRAVENOUS | Status: AC
  Administered 2023-04-05: 5 10*6.[IU] via INTRAVENOUS
  Filled 2023-04-05: qty 5

## 2023-04-05 MED ORDER — EPHEDRINE 5 MG/ML INJ: 10.0000 mg | INTRAVENOUS | Status: DC | PRN

## 2023-04-05 MED ORDER — AMMONIA AROMATIC IN INHA
RESPIRATORY_TRACT | Status: AC
  Filled 2023-04-05: qty 10

## 2023-04-05 MED ORDER — PENICILLIN G POT IN DEXTROSE 60000 UNIT/ML IV SOLN
3.0000 10*6.[IU] | INTRAVENOUS | Status: DC
  Administered 2023-04-06 (×2): 3 10*6.[IU] via INTRAVENOUS
  Filled 2023-04-05 (×2): qty 50

## 2023-04-05 NOTE — H&P (Signed)
OB History & Physical   History of Present Illness:   Chief Complaint: contractions   HPI:  Carolyn Thompson is a 22 y.o. [phone] female at [redacted]w[redacted]d, Patient's last menstrual period was 07/03/2022 (exact date)., consistent with [place] at [redacted]w[redacted]d, with Estimated Date of Delivery: 04/09/23.  She presents to L&D for contractions and mucus discharge. States she's been having stringy, mucus discharge for 24 hours.  She's unsure if she's leaking.  Contractions started earlier this evening.  Her pregnancy is complicated by abnormal AFP, maternal varicella non-immune, and GBS positive .  She denies Vaginal bleeding. Endorses fetal movement as active.   Reports active fetal movement  Contractions: every 7 to 9 minutes LOF/SROM: unsure - having more mucus discharge but unsure if she's leaking  Vaginal bleeding: denies   Factors complicating pregnancy:  Principal Problem:   Normal labor Active Problems:   Positive GBS test   Abnormal MSAFP (maternal serum alpha-fetoprotein), elevated   Maternal varicella, non-immune    Patient Active Problem List   Diagnosis Date Noted   Normal labor 04/05/2023   Maternal varicella, non-immune 04/05/2023   Positive GBS test 04/02/2023   Abnormal MSAFP (maternal serum alpha-fetoprotein), elevated 11/14/2022   Supervision of other normal pregnancy, antepartum 09/11/2022    Prenatal Transfer Tool  Maternal Diabetes: No Genetic Screening: Abnormal:  Results: Elevated AFP - MFM [place] normal  Maternal Ultrasounds/Referrals: Normal Fetal Ultrasounds or other Referrals:  Referred to Materal Fetal Medicine  Maternal Substance Abuse:  No Significant Maternal Medications:  None Significant Maternal Lab Results: Group B Strep positive  Maternal Medical History:   Past Medical History:  Diagnosis Date   Anemia     Past Surgical History:  Procedure Laterality Date   TONSILLECTOMY     Age 33    Allergies  Allergen Reactions   Shellfish Allergy Itching    Prior  to Admission medications   Medication Sig Start Date End Date Taking? Authorizing Provider  ferrous sulfate 325 (65 FE) MG EC tablet Take 325 mg by mouth 3 (three) times daily with meals.   Yes [provider]  Prenatal Vit-Fe Fumarate-FA (MULTIVITAMIN-PRENATAL) 27-0.8 MG TABS tablet Take 1 tablet by mouth daily at 12 noon.   Yes [provider]  clotrimazole (LOTRIMIN) 1 % cream Apply 1 Application topically 2 (two) times daily. Apply small amount to area bid Patient not taking: Reported on 04/05/2023 10/04/22   Tommi Rumps, PA-C     Prenatal care site:  [facility]  OB History  Gravida Para Term Preterm AB Living  4 2 2  0 1 2  SAB IAB Ectopic Multiple Live Births  1 0 0 0 2    # Outcome Date GA Lbr Len/2nd Weight Sex Type Anes PTL Lv  4 Current           3 Term 09/18/20 [redacted]w[redacted]d 05:07 / 00:21  M Vag-Spont EPI  LIV     Name: Osika,BOY Shanaya     Apgar1: 9  Apgar5: 9  2 SAB 2021          1 Term 11/08/18 [redacted]w[redacted]d / 02:55 3410 g F Vag-Vacuum EPI  LIV     Name: Mow,GIRL Dava     Apgar1: 8  Apgar5: 9     Social History: She  reports that she has been smoking e-cigarettes. She has never used smokeless tobacco. She reports that she does not currently use alcohol. She reports that she does not use drugs.  Family History: family  history is not on file.   Review of Systems: A full review of systems was performed and negative except as noted in the HPI.     Physical Exam:  Vital Signs: BP 135/86 (BP Location: Left Arm)   Pulse (!) 112   Temp 98.3 F (36.8 C) (Oral)   Resp 16   LMP 07/03/2022 (Exact Date)   General: no acute distress.  HEENT: normocephalic, atraumatic Heart: regular rate & rhythm Lungs: normal respiratory effort Abdomen: soft, gravid, non-tender;  EFW: 7 lbs  Pelvic:   External: Normal external female genitalia  Cervix: Dilation: 5 / Effacement (%): 80 / Station: -2    Extremities: non-tender, symmetric, no edema  bilaterally.  DTRs: 2+/2+  Neurologic: Alert & oriented x 3.    No results found for this or any previous visit (from the past 24 hour(s)).  Pertinent Results:  Prenatal Labs: Blood type/Rh B POS   Antibody screen Negative    Rubella Immune    Varicella Not immune  RPR NR    HBsAg Neg   Hep C NR   HIV NR    GC neg  Chlamydia neg  Genetic screening cfDNA negative/Elevated AFP - Positive for OSB, normal MFM [place]   1 hour GTT 137  3 hour GTT 91, 186, 108, 133   GBS Positive     FHT:  FHR: 135 bpm, variability: moderate,  accelerations:  Present,  decelerations:  Absent Category/reactivity:  Category I UC:   regular, every 7-9 minutes   Cephalic by [place] and SVE   No results found.  Assessment:  Carolyn Thompson is a 22 y.o. [phone] female at [redacted]w[redacted]d with early labor and GBS positive status.   Plan:  1. Admit to Labor & Delivery - Admission status: Inpatient - Dr Beverly Gust MD notified of admission and plan of care  - Reason for admission: labor management - consents reviewed and obtained  2. Fetal Well being  - Fetal Tracing: cat 1 - Group B Streptococcus ppx indicated: GBS positive - Presentation: cephalic confirmed by SVE   3. Routine OB: - Prenatal labs reviewed, as above - Rh positive - CBC, T&S, RPR on admit - Regular diet, saline lock  4. Monitoring of labor  - Contractions monitored with external toco - Pelvis proven to 7lbs 8oz, adequate for trial of labor  - Plan for expectant management  - Augmentation with oxytocin and AROM as appropriate  - Plan for  continuous fetal monitoring - Maternal pain control as desired; planning regional anesthesia - Anticipate vaginal delivery  5. Post Partum Planning: - Infant feeding: formula feeding - Contraception: bilateral tubal ligation - Flu vaccine: Given prenatally - Tdap vaccine: Given prenatally - RSV vaccine: declined   Gustavo Lah, CNM 04/05/23 8:46 PM  Margaretmary Eddy, CNM Certified Nurse  Midwife [place]  [facility]

## 2023-04-06 ENCOUNTER — Inpatient Hospital Stay: Payer: Medicaid Other | Admitting: Anesthesiology

## 2023-04-06 ENCOUNTER — Inpatient Hospital Stay: Payer: Medicaid Other | Admitting: General Practice

## 2023-04-06 ENCOUNTER — Other Ambulatory Visit: Payer: Self-pay

## 2023-04-06 ENCOUNTER — Encounter: Admission: EM | Disposition: A | Discharge status: Home / Self Care | Payer: Self-pay | Source: Home / Self Care

## 2023-04-06 HISTORY — PX: TUBAL LIGATION: SHX77

## 2023-04-06 LAB — TYPE AND SCREEN

## 2023-04-06 LAB — RPR: RPR Ser Ql: NONREACTIVE

## 2023-04-06 SURGERY — LIGATION, FALLOPIAN TUBE, POSTPARTUM
Anesthesia: General | Site: Abdomen | Laterality: Bilateral

## 2023-04-06 MED ORDER — FAMOTIDINE 20 MG PO TABS: 40.0000 mg | ORAL_TABLET | Freq: Once | ORAL | Status: DC

## 2023-04-06 MED ORDER — BENZOCAINE-MENTHOL 20-0.5 % EX AERO
1.0000 | INHALATION_SPRAY | CUTANEOUS | Status: DC | PRN
  Filled 2023-04-06: qty 56

## 2023-04-06 MED ORDER — OXYCODONE HCL 5 MG PO TABS: 5.0000 mg | ORAL_TABLET | Freq: Four times a day (QID) | ORAL | Status: DC | PRN

## 2023-04-06 MED ORDER — FENTANYL CITRATE (PF) 100 MCG/2ML IJ SOLN
INTRAMUSCULAR | Status: AC
  Filled 2023-04-06: qty 2

## 2023-04-06 MED ORDER — KETOROLAC TROMETHAMINE 30 MG/ML IJ SOLN
INTRAMUSCULAR | Status: DC | PRN
  Administered 2023-04-06: 30 mg via INTRAVENOUS

## 2023-04-06 MED ORDER — SODIUM CHLORIDE 0.9 % IV SOLN: INTRAVENOUS | Status: DC | PRN

## 2023-04-06 MED ORDER — SUCCINYLCHOLINE CHLORIDE 200 MG/10ML IV SOSY
PREFILLED_SYRINGE | INTRAVENOUS | Status: DC | PRN
  Administered 2023-04-06: 100 mg via INTRAVENOUS

## 2023-04-06 MED ORDER — DEXAMETHASONE SODIUM PHOSPHATE 10 MG/ML IJ SOLN
INTRAMUSCULAR | Status: DC | PRN
  Administered 2023-04-06: 10 mg via INTRAVENOUS

## 2023-04-06 MED ORDER — ACETAMINOPHEN 10 MG/ML IV SOLN
INTRAVENOUS | Status: AC
  Filled 2023-04-06: qty 100

## 2023-04-06 MED ORDER — SIMETHICONE 80 MG PO CHEW: 80.0000 mg | CHEWABLE_TABLET | ORAL | Status: DC | PRN

## 2023-04-06 MED ORDER — IBUPROFEN 600 MG PO TABS
600.0000 mg | ORAL_TABLET | Freq: Four times a day (QID) | ORAL | Status: DC
  Administered 2023-04-06 – 2023-04-07 (×4): 600 mg via ORAL
  Filled 2023-04-06 (×4): qty 1

## 2023-04-06 MED ORDER — LIDOCAINE-EPINEPHRINE (PF) 1.5 %-1:200000 IJ SOLN
INTRAMUSCULAR | Status: DC | PRN
  Administered 2023-04-06: 3 mL via EPIDURAL

## 2023-04-06 MED ORDER — ONDANSETRON HCL 4 MG PO TABS: 4.0000 mg | ORAL_TABLET | ORAL | Status: DC | PRN

## 2023-04-06 MED ORDER — ZOLPIDEM TARTRATE 5 MG PO TABS: 5.0000 mg | ORAL_TABLET | Freq: Every evening | ORAL | Status: DC | PRN

## 2023-04-06 MED ORDER — ONDANSETRON HCL 4 MG/2ML IJ SOLN
INTRAMUSCULAR | Status: DC | PRN
  Administered 2023-04-06: 4 mg via INTRAVENOUS

## 2023-04-06 MED ORDER — ACETAMINOPHEN 500 MG PO TABS
1000.0000 mg | ORAL_TABLET | Freq: Four times a day (QID) | ORAL | Status: DC
  Administered 2023-04-06 – 2023-04-07 (×3): 1000 mg via ORAL
  Filled 2023-04-06 (×4): qty 2

## 2023-04-06 MED ORDER — ONDANSETRON HCL 4 MG/2ML IJ SOLN
INTRAMUSCULAR | Status: AC
  Filled 2023-04-06: qty 2

## 2023-04-06 MED ORDER — STERILE WATER FOR IRRIGATION IR SOLN
Status: DC | PRN
  Administered 2023-04-06: 500 mL

## 2023-04-06 MED ORDER — MIDAZOLAM HCL 2 MG/2ML IJ SOLN
INTRAMUSCULAR | Status: AC
  Filled 2023-04-06: qty 2

## 2023-04-06 MED ORDER — FENTANYL CITRATE (PF) 100 MCG/2ML IJ SOLN
25.0000 ug | INTRAMUSCULAR | Status: DC | PRN
  Administered 2023-04-06: 25 ug via INTRAVENOUS

## 2023-04-06 MED ORDER — GLYCOPYRROLATE 0.2 MG/ML IJ SOLN
INTRAMUSCULAR | Status: DC | PRN
  Administered 2023-04-06: .2 mg via INTRAVENOUS

## 2023-04-06 MED ORDER — MIDAZOLAM HCL 2 MG/2ML IJ SOLN
INTRAMUSCULAR | Status: DC | PRN
  Administered 2023-04-06: 2 mg via INTRAVENOUS

## 2023-04-06 MED ORDER — DIBUCAINE (PERIANAL) 1 % EX OINT
1.0000 | TOPICAL_OINTMENT | CUTANEOUS | Status: DC | PRN
  Administered 2023-04-07: 1 via RECTAL
  Filled 2023-04-06 (×2): qty 28

## 2023-04-06 MED ORDER — WITCH HAZEL-GLYCERIN EX PADS
1.0000 | MEDICATED_PAD | CUTANEOUS | Status: DC | PRN
  Filled 2023-04-06: qty 100

## 2023-04-06 MED ORDER — DIPHENHYDRAMINE HCL 25 MG PO CAPS: 25.0000 mg | ORAL_CAPSULE | Freq: Four times a day (QID) | ORAL | Status: DC | PRN

## 2023-04-06 MED ORDER — PROPOFOL 10 MG/ML IV BOLUS
INTRAVENOUS | Status: DC | PRN
  Administered 2023-04-06: 130 mg via INTRAVENOUS

## 2023-04-06 MED ORDER — LACTATED RINGERS IV SOLN: INTRAVENOUS | Status: AC

## 2023-04-06 MED ORDER — KETOROLAC TROMETHAMINE 30 MG/ML IJ SOLN
INTRAMUSCULAR | Status: AC
  Filled 2023-04-06: qty 1

## 2023-04-06 MED ORDER — DEXAMETHASONE SODIUM PHOSPHATE 10 MG/ML IJ SOLN
INTRAMUSCULAR | Status: AC
  Filled 2023-04-06: qty 1

## 2023-04-06 MED ORDER — BUPIVACAINE HCL 0.5 % IJ SOLN
INTRAMUSCULAR | Status: DC | PRN
  Administered 2023-04-06: 4 mL

## 2023-04-06 MED ORDER — FENTANYL CITRATE (PF) 100 MCG/2ML IJ SOLN
INTRAMUSCULAR | Status: DC | PRN
  Administered 2023-04-06 (×2): 50 ug via INTRAVENOUS

## 2023-04-06 MED ORDER — OXYCODONE HCL 5 MG PO TABS
5.0000 mg | ORAL_TABLET | Freq: Once | ORAL | Status: AC | PRN
  Administered 2023-04-06: 5 mg via ORAL

## 2023-04-06 MED ORDER — METOCLOPRAMIDE HCL 10 MG PO TABS
10.0000 mg | ORAL_TABLET | Freq: Once | ORAL | Status: DC
  Filled 2023-04-06: qty 1

## 2023-04-06 MED ORDER — OXYCODONE HCL 5 MG PO TABS
ORAL_TABLET | ORAL | Status: AC
  Filled 2023-04-06: qty 1

## 2023-04-06 MED ORDER — ACETAMINOPHEN 10 MG/ML IV SOLN
INTRAVENOUS | Status: DC | PRN
  Administered 2023-04-06: 1000 mg via INTRAVENOUS

## 2023-04-06 MED ORDER — OXYCODONE HCL 5 MG/5ML PO SOLN: 5.0000 mg | Freq: Once | ORAL | Status: AC | PRN

## 2023-04-06 MED ORDER — FERROUS SULFATE 325 (65 FE) MG PO TABS
325.0000 mg | ORAL_TABLET | Freq: Two times a day (BID) | ORAL | Status: DC
  Administered 2023-04-07: 325 mg via ORAL
  Filled 2023-04-06: qty 1

## 2023-04-06 MED ORDER — LIDOCAINE HCL (CARDIAC) PF 100 MG/5ML IV SOSY
PREFILLED_SYRINGE | INTRAVENOUS | Status: DC | PRN
  Administered 2023-04-06: 80 mg via INTRAVENOUS

## 2023-04-06 MED ORDER — LIDOCAINE HCL (PF) 2 % IJ SOLN
INTRAMUSCULAR | Status: AC
  Filled 2023-04-06: qty 5

## 2023-04-06 MED ORDER — ONDANSETRON HCL 4 MG/2ML IJ SOLN: 4.0000 mg | INTRAMUSCULAR | Status: DC | PRN

## 2023-04-06 MED ORDER — SUCCINYLCHOLINE CHLORIDE 200 MG/10ML IV SOSY
PREFILLED_SYRINGE | INTRAVENOUS | Status: AC
  Filled 2023-04-06: qty 10

## 2023-04-06 MED ORDER — PRENATAL MULTIVITAMIN CH
1.0000 | ORAL_TABLET | Freq: Every day | ORAL | Status: DC
  Administered 2023-04-07: 1 via ORAL
  Filled 2023-04-06: qty 1

## 2023-04-06 MED ORDER — DEXMEDETOMIDINE HCL IN NACL 80 MCG/20ML IV SOLN
INTRAVENOUS | Status: DC | PRN
  Administered 2023-04-06: 12 ug via INTRAVENOUS

## 2023-04-06 MED ORDER — BUPIVACAINE HCL (PF) 0.25 % IJ SOLN
INTRAMUSCULAR | Status: DC | PRN
  Administered 2023-04-06: 8 mL via EPIDURAL

## 2023-04-06 MED ORDER — GLYCOPYRROLATE 0.2 MG/ML IJ SOLN
INTRAMUSCULAR | Status: AC
  Filled 2023-04-06: qty 1

## 2023-04-06 MED ORDER — COCONUT OIL OIL: 1.0000 | TOPICAL_OIL | Status: DC | PRN

## 2023-04-06 MED ORDER — SENNOSIDES-DOCUSATE SODIUM 8.6-50 MG PO TABS
2.0000 | ORAL_TABLET | Freq: Every day | ORAL | Status: DC
  Administered 2023-04-07: 2 via ORAL
  Filled 2023-04-06: qty 2

## 2023-04-06 SURGICAL SUPPLY — 39 items
ADH SKN CLS APL DERMABOND .7 (GAUZE/BANDAGES/DRESSINGS)
APL PRP STRL LF DISP 70% ISPRP (MISCELLANEOUS) ×1
APL SWBSTK 6 STRL LF DISP (MISCELLANEOUS) ×1
APPLICATOR COTTON TIP 6 STRL (MISCELLANEOUS) ×1 IMPLANT
APPLICATOR COTTON TIP 6IN STRL (MISCELLANEOUS) ×1 IMPLANT
BLADE SURG SZ11 CARB STEEL (BLADE) ×1 IMPLANT
CHLORAPREP W/TINT 26 (MISCELLANEOUS) ×1 IMPLANT
DERMABOND ADVANCED .7 DNX12 (GAUZE/BANDAGES/DRESSINGS) ×1 IMPLANT
DRAPE LAPAROTOMY 77X122 PED (DRAPES) ×1 IMPLANT
DRSG TEGADERM 2-3/8X2-3/4 SM (GAUZE/BANDAGES/DRESSINGS) IMPLANT
DRSG TEGADERM 4X4.75 (GAUZE/BANDAGES/DRESSINGS) IMPLANT
ELECT CAUTERY BLADE 6.4 (BLADE) ×1 IMPLANT
ELECT REM PT RETURN 9FT ADLT (ELECTROSURGICAL) ×1
ELECTRODE REM PT RTRN 9FT ADLT (ELECTROSURGICAL) ×1 IMPLANT
GAUZE 4X4 16PLY ~~LOC~~+RFID DBL (SPONGE) ×1 IMPLANT
GAUZE SPONGE 2X2 STRL 8-PLY (GAUZE/BANDAGES/DRESSINGS) ×1 IMPLANT
GLOVE SURG SYN 8.0 (GLOVE) ×1 IMPLANT
GLOVE SURG SYN 8.0 PF PI (GLOVE) ×1 IMPLANT
GOWN STRL REUS W/ TWL LRG LVL3 (GOWN DISPOSABLE) ×1 IMPLANT
GOWN STRL REUS W/ TWL XL LVL3 (GOWN DISPOSABLE) ×1 IMPLANT
GOWN STRL REUS W/TWL LRG LVL3 (GOWN DISPOSABLE) ×1
GOWN STRL REUS W/TWL XL LVL3 (GOWN DISPOSABLE) ×1
KIT TURNOVER KIT A (KITS) ×1 IMPLANT
LABEL OR SOLS (LABEL) ×1 IMPLANT
MANIFOLD NEPTUNE II (INSTRUMENTS) ×1 IMPLANT
NDL HYPO 22X1.5 SAFETY MO (MISCELLANEOUS) ×1 IMPLANT
NEEDLE HYPO 22X1.5 SAFETY MO (MISCELLANEOUS) ×1 IMPLANT
NS IRRIG 500ML POUR BTL (IV SOLUTION) ×1 IMPLANT
PACK BASIN MINOR ARMC (MISCELLANEOUS) ×1 IMPLANT
SCRUB CHG 4% DYNA-HEX 4OZ (MISCELLANEOUS) ×1 IMPLANT
STRIP CLOSURE SKIN 1/4X4 (GAUZE/BANDAGES/DRESSINGS) IMPLANT
SUT PLAIN GUT 0 (SUTURE) ×2 IMPLANT
SUT VIC AB 2-0 UR6 27 (SUTURE) ×1 IMPLANT
SUT VIC AB 4-0 SH 27 (SUTURE) ×1
SUT VIC AB 4-0 SH 27XANBCTRL (SUTURE) ×1 IMPLANT
SWABSTK COMLB BENZOIN TINCTURE (MISCELLANEOUS) IMPLANT
SYR 10ML LL (SYRINGE) ×1 IMPLANT
TRAP FLUID SMOKE EVACUATOR (MISCELLANEOUS) ×1 IMPLANT
WATER STERILE IRR 500ML POUR (IV SOLUTION) ×1 IMPLANT

## 2023-04-06 NOTE — Discharge Summary (Signed)
Postpartum Discharge Summary  Patient Name: Carolyn Thompson DOB: [birth year] MRN: [id number]  Date of admission: 04/05/2023 Delivery date:04/06/2023 Delivering provider: Gustavo Lah Date of discharge: 04/07/2023  Primary OB: [facility] [id number] last menstrual period was 07/03/2022 (exact date). EDC Estimated Date of Delivery: 04/09/23 Gestational Age at Delivery: [redacted]w[redacted]d   Admitting diagnosis: Uterine contractions during pregnancy [O47.9] Normal labor [O80, Z37.9] Intrauterine pregnancy: [redacted]w[redacted]d     Secondary diagnosis:   Principal Problem:   Normal labor Active Problems:   Positive GBS test   Abnormal MSAFP (maternal serum alpha-fetoprotein), elevated   Maternal varicella, non-immune   NSVD (normal spontaneous vaginal delivery)   Discharge Diagnosis: Term Pregnancy Delivered      Hospital course: Onset of Labor With Vaginal Delivery      22 y.o. yo [id number] at [redacted]w[redacted]d was admitted in Latent Labor on 04/05/2023. She was initially expectantly managed.  AROM was done for augmentation. She progressed well to C/C/+2 with an urge to push.  She pushed effectively over approximately 10 minutes for a spontaneous vaginal birth.  Membrane Rupture Time/Date: 6:17 AM,04/06/2023  Delivery Method:Vaginal, Spontaneous Operative Delivery:N/A Episiotomy: N/A Lacerations: None Patient had an uncomplicated postpartum course.  She is ambulating, tolerating a regular diet, passing flatus, and urinating well. Patient is discharged home in stable condition on 04/07/23.  Newborn Data: Birth date:04/06/2023 Birth time:8:43 AM Gender:Female "Carolyn Thompson" Living status:Living Apgars:8 ,9  Weight:3300 g                                            Post partum procedures:postpartum tubal ligation Augmentation:: AROM Complications: None Delivery Type: spontaneous vaginal delivery Anesthesia: epidural anesthesia Placenta: spontaneous To Pathology: No   Prenatal Labs:  Blood type/Rh B POS    Antibody screen Negative    Rubella Immune    Varicella Not immune  RPR NR    HBsAg Neg   Hep C NR   HIV NR    GC neg  Chlamydia neg  Genetic screening cfDNA negative/Elevated AFP - Positive for OSB, normal MFM [place]   1 hour GTT 137  3 hour GTT 91, 186, 108, 133   GBS Positive      Magnesium Sulfate received: No BMZ received: No Rhophylac:was not indicated MMR: was not indicated Varivax vaccine given: was offered and declined by the patient T-DaP:Given prenatally Flu: Given prenatally  Transfusion:No  Physical exam  Vitals:   04/06/23 1934 04/06/23 2324 04/07/23 0417 04/07/23 0816  BP: 126/71 127/74 117/66 120/76  Pulse: 78 78 (!) 57 (!) 52  Resp: 18 18 18 14   Temp: 98.3 F (36.8 C) 97.8 F (36.6 C) 98.2 F (36.8 C) 98.3 F (36.8 C)  TempSrc: Oral Oral Oral Oral  SpO2: 97% 100% 99% 100%  Weight:      Height:       General: alert, cooperative, and no distress Lochia: appropriate Uterine Fundus: firm Perineum: minimal edema/intact DVT Evaluation: No evidence of DVT seen on physical exam.  Labs: Lab Results  Component Value Date   WBC 13.0 (H) 04/07/2023   HGB 11.2 (L) 04/07/2023   HCT 34.8 (L) 04/07/2023   MCV 77.5 (L) 04/07/2023   PLT 183 04/07/2023      Latest Ref Rng & Units 09/20/2020    3:35 PM  CMP  Glucose 70 - 99 mg/dL 086   BUN 6 - 20 mg/dL 5  Creatinine 0.44 - 1.00 mg/dL 1.61   Sodium 096 - 045 mmol/L 139   Potassium 3.5 - 5.1 mmol/L 3.3   Chloride 98 - 111 mmol/L 108   CO2 22 - 32 mmol/L 23   Calcium 8.9 - 10.3 mg/dL 8.6   Total Protein 6.5 - 8.1 g/dL 6.1   Total Bilirubin 0.3 - 1.2 mg/dL 0.5   Alkaline Phos 38 - 126 U/L 163   AST 15 - 41 U/L 22   ALT 0 - 44 U/L 12    Edinburgh Score:    04/07/2023    9:17 AM  Edinburgh Postnatal Depression Scale Screening Tool  I have been able to laugh and see the funny side of things. 0  I have looked forward with enjoyment to things. 0  I have blamed myself unnecessarily when things went  wrong. 1  I have been anxious or worried for no good reason. 1  I have felt scared or panicky for no good reason. 0  Things have been getting on top of me. 0  I have been so unhappy that I have had difficulty sleeping. 0  I have felt sad or miserable. 1  I have been so unhappy that I have been crying. 0  The thought of harming myself has occurred to me. 0  Edinburgh Postnatal Depression Scale Total 3    Risk assessment for postpartum VTE and prophylactic treatment: Very high risk factors: None High risk factors: None Moderate risk factors: None  Postpartum VTE prophylaxis with LMWH not indicated  After visit meds:  Allergies as of 04/07/2023       Reactions   Shellfish Allergy Itching        Medication List     STOP taking these medications    clotrimazole 1 % cream Commonly known as: LOTRIMIN   ferrous sulfate 325 (65 FE) MG EC tablet Replaced by: ferrous sulfate 325 (65 FE) MG tablet   multivitamin-prenatal 27-0.8 MG Tabs tablet       TAKE these medications    acetaminophen 500 MG tablet Commonly known as: TYLENOL Take 2 tablets (1,000 mg total) by mouth every 6 (six) hours.   benzocaine-Menthol 20-0.5 % Aero Commonly known as: DERMOPLAST Apply 1 Application topically as needed for irritation (perineal discomfort).   coconut oil Oil Apply 1 Application topically as needed.   dibucaine 1 % Oint Commonly known as: NUPERCAINAL Place 1 Application rectally as needed for hemorrhoids.   ferrous sulfate 325 (65 FE) MG tablet Take 1 tablet (325 mg total) by mouth daily with breakfast. Replaces: ferrous sulfate 325 (65 FE) MG EC tablet   ibuprofen 600 MG tablet Commonly known as: ADVIL Take 1 tablet (600 mg total) by mouth every 6 (six) hours.   oxyCODONE 5 MG immediate release tablet Commonly known as: Oxy IR/ROXICODONE Take 1-2 tablets (5-10 mg total) by mouth every 6 (six) hours as needed for up to 5 days for severe pain (pain score 7-10) or  breakthrough pain.   senna-docusate 8.6-50 MG tablet Commonly known as: Senokot-S Take 2 tablets by mouth daily. Start taking on: April 08, 2023   simethicone 80 MG chewable tablet Commonly known as: MYLICON Chew 1 tablet (80 mg total) by mouth as needed for flatulence.   witch hazel-glycerin pad Commonly known as: TUCKS Apply 1 Application topically as needed for hemorrhoids.       Discharge home in stable condition Infant Feeding: Bottle Infant Disposition:home with mother Discharge instruction: per After Visit Summary and  Postpartum booklet. Activity: Advance as tolerated. Pelvic rest for 6 weeks.  Diet: routine diet Anticipated Birth Control: BTL done PP Postpartum Appointment:6 weeks Additional Postpartum F/U: Incision check 2 weeks  Future Appointments:No future appointments. Follow up Visit:  Follow-up Information     Gustavo Lah, CNM. Schedule an appointment as soon as possible for a visit in 6 week(s).   Specialty: Certified Nurse Midwife Why: postpartum visit Contact information: [address] [place] [address] [phone]         Schermerhorn, Ihor Austin, MD Follow up in 2 week(s).   Specialty: Obstetrics and Gynecology Why: post op incision check/ mood check Contact information: [address] [place] [address] [phone]                 Plan:  Carolyn Thompson was discharged to home in good condition. Follow-up appointment as directed.    Signed:  Chari Manning CNM

## 2023-04-06 NOTE — Anesthesia Preprocedure Evaluation (Signed)
Anesthesia Evaluation  Patient identified by MRN, date of birth, ID band Patient awake    Reviewed: Allergy & Precautions, NPO status , Patient's Chart, lab work & pertinent test results  Airway Mallampati: III  TM Distance: >3 FB Neck ROM: full    Dental  (+) Chipped, Dental Advidsory Given   Pulmonary neg pulmonary ROS, Current Smoker   Pulmonary exam normal        Cardiovascular negative cardio ROS Normal cardiovascular exam     Neuro/Psych negative neurological ROS  negative psych ROS   GI/Hepatic negative GI ROS, Neg liver ROS,,,  Endo/Other  negative endocrine ROS    Renal/GU      Musculoskeletal   Abdominal   Peds  Hematology negative hematology ROS (+)   Anesthesia Other Findings Past Medical History: No date: Anemia  Past Surgical History: No date: TONSILLECTOMY     Comment:  Age 22  BMI    Body Mass Index: 24.13 kg/m      Reproductive/Obstetrics negative OB ROS                             Anesthesia Physical Anesthesia Plan  ASA: 1  Anesthesia Plan: General ETT and General   Post-op Pain Management:    Induction: Intravenous  PONV Risk Score and Plan: 3 and Ondansetron, Dexamethasone and Midazolam  Airway Management Planned: Oral ETT  Additional Equipment:   Intra-op Plan:   Post-operative Plan: Extubation in OR  Informed Consent: I have reviewed the patients History and Physical, chart, labs and discussed the procedure including the risks, benefits and alternatives for the proposed anesthesia with the patient or authorized representative who has indicated his/her understanding and acceptance.     Dental Advisory Given  Plan Discussed with: Anesthesiologist, CRNA and Surgeon  Anesthesia Plan Comments: (Patient consented for risks of anesthesia including but not limited to:  - adverse reactions to medications - damage to eyes, teeth, lips or other  oral mucosa - nerve damage due to positioning  - sore throat or hoarseness - Damage to heart, brain, nerves, lungs, other parts of body or loss of life  Patient voiced understanding and assent.)       Anesthesia Quick Evaluation

## 2023-04-06 NOTE — Discharge Instructions (Signed)
Vaginal Delivery, Care After Refer to this sheet in the next few weeks. These discharge instructions provide you with information on caring for yourself after delivery. Your caregiver may also give you specific instructions. Your treatment has been planned according to the most current medical practices available, but problems sometimes occur. Call your caregiver if you have any problems or questions after you go home. HOME CARE INSTRUCTIONS Take over-the-counter or prescription medicines only as directed by your caregiver or pharmacist. Do not drink alcohol, especially if you are breastfeeding or taking medicine to relieve pain. Do not smoke tobacco. Continue to use good perineal care. Good perineal care includes: Wiping your perineum from back to front Keeping your perineum clean. You can do sitz baths twice a day, to help keep this area clean Do not use tampons, douche or have sex until your caregiver says it is okay. Shower only and avoid sitting in submerged water, aside from sitz baths Wear a well-fitting bra that provides breast support. Eat healthy foods. Drink enough fluids to keep your urine clear or pale yellow. Eat high-fiber foods such as whole grain cereals and breads, brown rice, beans, and fresh fruits and vegetables every day. These foods may help prevent or relieve constipation. Avoid constipation with high fiber foods or medications, such as miralax or metamucil Follow your caregiver's recommendations regarding resumption of activities such as climbing stairs, driving, lifting, exercising, or traveling. Talk to your caregiver about resuming sexual activities. Resumption of sexual activities is dependent upon your risk of infection, your rate of healing, and your comfort and desire to resume sexual activity. Try to have someone help you with your household activities and your newborn for at least a few days after you leave the hospital. Rest as much as possible. Try to rest or  take a nap when your newborn is sleeping. Increase your activities gradually. Keep all of your scheduled postpartum appointments. It is very important to keep your scheduled follow-up appointments. At these appointments, your caregiver will be checking to make sure that you are healing physically and emotionally. SEEK MEDICAL CARE IF:  You are passing large clots from your vagina. Save any clots to show your caregiver. You have a foul smelling discharge from your vagina. You have trouble urinating. You are urinating frequently. You have pain when you urinate. You have a change in your bowel movements. You have increasing redness, pain, or swelling near your vaginal incision (episiotomy) or vaginal tear. You have pus draining from your episiotomy or vaginal tear. Your episiotomy or vaginal tear is separating. You have painful, hard, or reddened breasts. You have a severe headache. You have blurred vision or see spots. You feel sad or depressed. You have thoughts of hurting yourself or your newborn. You have questions about your care, the care of your newborn, or medicines. You are dizzy or light-headed. You have a rash. You have nausea or vomiting. You were breastfeeding and have not had a menstrual period within 12 weeks after you stopped breastfeeding. You are not breastfeeding and have not had a menstrual period by the 12th week after delivery. You have a fever. SEEK IMMEDIATE MEDICAL CARE IF:  You have persistent pain. You have chest pain. You have shortness of breath. You faint. You have leg pain. You have stomach pain. Your vaginal bleeding saturates two or more sanitary pads in 1 hour. MAKE SURE YOU:  Understand these instructions. Will watch your condition. Will get help right away if you are not doing well or  get worse. Document Released: 06/01/2000 Document Revised: 10/19/2013 Document Reviewed: 01/30/2012 [facility] Patient Information 2015 [place], [place]. This  information is not intended to replace advice given to you by your health care provider. Make sure you discuss any questions you have with your health care provider.  Sitz Bath A sitz bath is a warm water bath taken in the sitting position. The water covers only the hips and butt (buttocks). We recommend using one that fits in the toilet, to help with ease of use and cleanliness. It may be used for either healing or cleaning purposes. Sitz baths are also used to relieve pain, itching, or muscle tightening (spasms). The water may contain medicine. Moist heat will help you heal and relax.  HOME CARE  Take 3 to 4 sitz baths a day. Fill the bathtub half-full with warm water. Sit in the water and open the drain a little. Turn on the warm water to keep the tub half-full. Keep the water running constantly. Soak in the water for 15 to 20 minutes. After the sitz bath, pat the affected area dry. GET HELP RIGHT AWAY IF: You get worse instead of better. Stop the sitz baths if you get worse. MAKE SURE YOU: Understand these instructions. Will watch your condition. Will get help right away if you are not doing well or get worse. Document Released: 07/12/2004 Document Revised: 02/27/2012 Document Reviewed: 10/02/2010 [facility] Patient Information 2015 [place], [place]. This information is not intended to replace advice given to you by your health care provider. Make sure you discuss any questions you have with your health care provider.

## 2023-04-06 NOTE — Anesthesia Procedure Notes (Signed)
Epidural Patient location during procedure: OB Start time: 04/06/2023 6:40 AM End time: 04/06/2023 6:49 AM  Staffing Anesthesiologist: Reed Breech, MD Performed: anesthesiologist   Preanesthetic Checklist Completed: patient identified, IV checked, risks and benefits discussed, surgical consent, monitors and equipment checked, pre-op evaluation and timeout performed  Epidural Patient position: sitting Prep: Betadine Patient monitoring: heart rate, continuous pulse ox and blood pressure Approach: midline Location: L3-L4 Injection technique: LOR air  Needle:  Needle type: Tuohy  Needle gauge: 17 G Needle length: 9 cm Needle insertion depth: 5 cm Catheter at skin depth: 10 cm Test dose: negative and 1.5% lidocaine with Epi 1:200 K  Assessment Sensory level: T4  Additional Notes Straightforward placement without apparent complications. Reason for block:procedure for pain

## 2023-04-06 NOTE — Progress Notes (Signed)
L&D Note    Subjective:  Doing well, coping well with contractions   Objective:   Vitals:   04/06/23 0651 04/06/23 0653 04/06/23 0655 04/06/23 0700  BP: 133/76 130/81 133/83 127/78  Pulse: 98 97 (!) 103 95  Resp:      Temp: 98.5 F (36.9 C)     TempSrc: Oral     SpO2: 99%  96% 97%  Weight:      Height:        Current Vital Signs 24h Vital Sign Ranges  T 98.5 F (36.9 C) Temp  Avg: 98.3 F (36.8 C)  Min: 98 F (36.7 C)  Max: 98.5 F (36.9 C)  BP 127/78 BP  Min: 127/78  Max: 135/86  HR 95 Pulse  Avg: 97.4  Min: 84  Max: 112  RR 18 Resp  Avg: 17.3  Min: 16  Max: 18  SaO2 97 %   SpO2  Avg: 97.3 %  Min: 96 %  Max: 99 %      Gen: alert, cooperative, no distress FHR: Baseline: 130 bpm, Variability: moderate, Accels: Present, Decels: none Toco: regular, every 3-6 minutes SVE: Dilation: 6 Effacement (%): 80 Station: -1 Exam by:: A. Yanice Maqueda CNM  Medications SCHEDULED MEDICATIONS   ammonia       lidocaine (PF)       misoprostol       oxytocin       oxytocin 40 units in LR 1000 mL  333 mL Intravenous Once   Oxytocin-Sodium Chloride       sodium chloride flush  3 mL Intravenous Q12H    MEDICATION INFUSIONS   sodium chloride     fentaNYL 2 mcg/mL w/bupivacaine 0.125% in NS 250 mL 12 mL/hr (04/06/23 0651)   lactated ringers     lactated ringers 125 mL/hr at 04/06/23 0708   oxytocin     pencillin G potassium IV Stopped (04/06/23 0602)    PRN MEDICATIONS  sodium chloride, acetaminophen, acetaminophen, ammonia, calcium carbonate, diphenhydrAMINE, ePHEDrine, ePHEDrine, fentaNYL (SUBLIMAZE) injection, fentaNYL 2 mcg/mL w/bupivacaine 0.125% in NS 250 mL, lactated ringers, lidocaine (PF), lidocaine (PF), misoprostol, ondansetron, oxytocin, Oxytocin-Sodium Chloride, phenylephrine, phenylephrine, sodium chloride flush, sodium citrate-citric acid   Assessment & Plan:  22 y.o. [id number] at [redacted]w[redacted]d admitted for early labor and GBS pos  -Labor: Early latent labor. -Fetal Well-being:  Category I -GBS: positive PCN x 3 -Membranes ruptured, clear fluid, artificially at 0617 -Intervention: AROM  -Analgesia: regional anesthesia   Gustavo Lah, CNM  04/06/2023 7:27 AM  [facility]

## 2023-04-06 NOTE — Op Note (Signed)
NAME: Carolyn Thompson, SENNA MEDICAL RECORD NO: [id number] ACCOUNT NO: [id number] DATE OF BIRTH: 2001/01/21 FACILITY: [facility] LOCATION: [facility] PHYSICIAN: Suzy Bouchard, MD  Operative Report   DATE OF PROCEDURE: 04/06/2023  PREOPERATIVE DIAGNOSIS:  Elective postpartum sterilization.  POSTOPERATIVE DIAGNOSIS:  Elective postpartum sterilization.  PROCEDURE:  Postpartum bilateral tubal ligation.  SURGEON:  Suzy Bouchard, MD  ANESTHESIA:  General endotracheal anesthesia.  INDICATIONS:  A 22 year old gravida 4, now para 3, status post spontaneous vaginal delivery approximately 8 hours previously.  The patient has committed to permanent sterilization and reconfirms her decision on the day of the procedure.  Risk had been  discussed including failure rate.  DESCRIPTION OF PROCEDURE:  After adequate general endotracheal anesthesia, the patient was placed in the dorsal supine position.  The patient's abdomen was prepped and draped in normal sterile fashion.  Timeout was performed.  A 10 mm infraumbilical  incision was made.  The fascia was opened sharply without difficulty.  The right fallopian tube was identified, grasped with a Babcock clamp and the fimbriated end visualized.  At the midportion of the right fallopian tube 2 separate 0 plain gut sutures  were placed and a 1.5 cm portion of fallopian tube was removed.  Good hemostasis noted.  Similar procedure was repeated on the patient's left fallopian tube after visualizing the fimbriated end.  Two separate 0 plain gut sutures were placed in the mid  portion of fallopian tube and a 1.5 cm portion of fallopian tube was removed.  Good hemostasis noted.  Fascia was then closed with 0 Vicryl suture in a running nonlocking fashion, good approximation of the tissue.  The skin was reapproximated with  interrupted 4-0 Vicryl suture.  Sterile dressing applied.  There were no complications.  INTRAOPERATIVE FLUIDS:  500 mL.  URINE  OUTPUT:  None.  ESTIMATED BLOOD LOSS:  Minimal.  The patient tolerated the procedure, was taken to recovery room in good condition.  The patient did receive 30 mg intravenous Toradol at the end of the procedure.   PUS D: 04/06/2023 4:14:17 pm T: 04/06/2023 4:37:00 pm  JOB: [id number]/ [id number]

## 2023-04-06 NOTE — Anesthesia Procedure Notes (Signed)
Procedure Name: Intubation Date/Time: 04/06/2023 3:13 PM  Performed by: Stormy Fabian, CRNAPre-anesthesia Checklist: Patient identified, Patient being monitored, Timeout performed, Emergency Drugs available and Suction available Patient Re-evaluated:Patient Re-evaluated prior to induction Oxygen Delivery Method: Circle system utilized Preoxygenation: Pre-oxygenation with 100% oxygen Induction Type: IV induction Ventilation: Mask ventilation without difficulty Laryngoscope Size: Mac and 3 Grade View: Grade I Tube type: Oral Tube size: 7.0 mm Number of attempts: 1 Airway Equipment and Method: Stylet Placement Confirmation: ETT inserted through vocal cords under direct vision, positive ETCO2 and breath sounds checked- equal and bilateral Secured at: 21 cm Tube secured with: Tape Dental Injury: Teeth and Oropharynx as per pre-operative assessment

## 2023-04-06 NOTE — Transfer of Care (Signed)
Immediate Anesthesia Transfer of Care Note  Patient: Carolyn Thompson  Procedure(s) Performed: Procedure(s): POST PARTUM TUBAL LIGATION (Bilateral)  Patient Location: PACU  Anesthesia Type:General  Level of Consciousness: sedated  Airway & Oxygen Therapy: Patient Spontanous Breathing and Patient connected to face mask oxygen  Post-op Assessment: Report given to RN and Post -op Vital signs reviewed and stable  Post vital signs: Reviewed and stable  Last Vitals:  Vitals:   04/06/23 1218 04/06/23 1600  BP: 120/70 (!) 97/47  Pulse: 69 79  Resp: 16 13  Temp: 37 C 36.7 C  SpO2:  96%    Complications: No apparent anesthesia complications

## 2023-04-06 NOTE — Anesthesia Post-op Follow-up Note (Signed)
  Anesthesia Pain Follow-up Note  Patient: Carolyn Thompson  Day #: 1  Date of Follow-up: 04/06/2023 Time: 9:07 AM  Last Vitals:  Vitals:   04/06/23 0752 04/06/23 0815  BP:  110/62  Pulse:  87  Resp:    Temp:    SpO2: 99% 98%    Level of Consciousness: alert  Pain: none   Side Effects:None  Catheter Site Exam:clean, dry, no drainage  Epidural / Intrathecal (From admission, onward)    Start     Dose/Rate Route Frequency Ordered Stop   04/05/23 2128  fentaNYL 2 mcg/mL w/ bupivacaine 0.125% in NS 250 mL epidural infusion        12 mL/hr 12 mL/hr  Epidural Continuous PRN 04/05/23 2128          Plan: D/C from anesthesia care at surgeon's request  [facility]

## 2023-04-06 NOTE — Progress Notes (Signed)
L&D Note    Subjective:  Coping well with contractions. States they do not feel more intense   Objective:   Vitals:   04/05/23 2009 04/05/23 2120 04/06/23 0113  BP: 135/86  128/66  Pulse: (!) 112  84  Resp: 16  18  Temp: 98.3 F (36.8 C)  98 F (36.7 C)  TempSrc: Oral  Oral  Weight:  65.8 kg   Height:  5\' 5"  (1.651 m)     Current Vital Signs 24h Vital Sign Ranges  T 98 F (36.7 C) Temp  Avg: 98.2 F (36.8 C)  Min: 98 F (36.7 C)  Max: 98.3 F (36.8 C)  BP 128/66 BP  Min: 128/66  Max: 135/86  HR 84 Pulse  Avg: 98  Min: 84  Max: 112  RR 18 Resp  Avg: 17  Min: 16  Max: 18  SaO2     No data recorded      Gen: alert, cooperative, no distress FHR: Baseline: 135 bpm, Variability: moderate, Accels: Present, Decels: none Toco: regular, every 5-9 minutes SVE: Dilation: 5.5 Effacement (%): 80 Station: -2 Exam by:: A. Emya Picado CNM  Medications SCHEDULED MEDICATIONS   ammonia       lidocaine (PF)       misoprostol       oxytocin       oxytocin 40 units in LR 1000 mL  333 mL Intravenous Once   Oxytocin-Sodium Chloride       sodium chloride flush  3 mL Intravenous Q12H    MEDICATION INFUSIONS   sodium chloride     fentaNYL 2 mcg/mL w/bupivacaine 0.125% in NS 250 mL     lactated ringers     lactated ringers     lactated ringers Stopped (04/06/23 0147)   oxytocin     pencillin G potassium IV Stopped (04/06/23 0142)    PRN MEDICATIONS  sodium chloride, acetaminophen, acetaminophen, ammonia, calcium carbonate, diphenhydrAMINE, ePHEDrine, ePHEDrine, fentaNYL (SUBLIMAZE) injection, fentaNYL 2 mcg/mL w/bupivacaine 0.125% in NS 250 mL, lactated ringers, lidocaine (PF), lidocaine (PF), misoprostol, ondansetron, oxytocin, Oxytocin-Sodium Chloride, phenylephrine, phenylephrine, sodium chloride flush, sodium citrate-citric acid   Assessment & Plan:  22 y.o. [id number] at [redacted]w[redacted]d admitted for early labor and GBS pos -Labor: Early latent labor. Declines augmentation at this time. FOB is  traveling from [place] and is expected to arrive in the next couple of hours.  -Fetal Well-being: Category I -GBS: positive - PCN x 2 dose  -Membranes intact  -Continue present management. Plan for augmentation when FOB arrives  -Analgesia: regional anesthesia when desired    Gustavo Lah, CNM  04/06/2023 2:08 AM  [facility]

## 2023-04-06 NOTE — Brief Op Note (Signed)
04/06/2023  3:53 PM  PATIENT:  Jacklynn Lewis  22 y.o. female  PRE-OPERATIVE DIAGNOSIS:  desires sterilization  POST-OPERATIVE DIAGNOSIS:  desires sterilization  PROCEDURE:  Procedure(s): POST PARTUM TUBAL LIGATION (Bilateral)  SURGEON:  Surgeons and Role:    * Jermale Crass, Ihor Austin, MD - Primary  PHYSICIAN ASSISTANT:   ASSISTANTS: none   ANESTHESIA:   general  EBL:  5 mL   BLOOD ADMINISTERED:none  DRAINS: none   LOCAL MEDICATIONS USED:  MARCAINE     SPECIMEN:  Source of Specimen:  portion right and left tube   DISPOSITION OF SPECIMEN:  PATHOLOGY  COUNTS:  YES  TOURNIQUET:  * No tourniquets in log *  DICTATION: .Other Dictation: Dictation Number verbal  PLAN OF CARE:  return to postpartum  PATIENT DISPOSITION:  PACU - hemodynamically stable.   Delay start of Pharmacological VTE agent (>24hrs) due to surgical blood loss or risk of bleeding: not applicable

## 2023-04-06 NOTE — Anesthesia Postprocedure Evaluation (Signed)
Anesthesia Post Note  Patient: Carolyn Thompson  Procedure(s) Performed: POST PARTUM TUBAL LIGATION (Bilateral: Abdomen)  Patient location during evaluation: PACU Anesthesia Type: General Level of consciousness: awake and alert Pain management: pain level controlled Vital Signs Assessment: post-procedure vital signs reviewed and stable Respiratory status: spontaneous breathing, nonlabored ventilation, respiratory function stable and patient connected to nasal cannula oxygen Cardiovascular status: blood pressure returned to baseline and stable Postop Assessment: no apparent nausea or vomiting Anesthetic complications: no  No notable events documented.   Last Vitals:  Vitals:   04/06/23 1840 04/06/23 1934  BP: 126/72 126/71  Pulse: 63 78  Resp: 18 18  Temp: 36.6 C 36.8 C  SpO2: 97% 97%    Last Pain:  Vitals:   04/06/23 1940  TempSrc:   PainSc: 4                  Stephanie Coup

## 2023-04-06 NOTE — Progress Notes (Signed)
Patient ID: Carolyn Thompson, female   DOB: [birth year], 22 y.o.   MRN: [id number] Pt is s/p uncomplicated SVD. Elective BTL . Anesthesiology doesn't want to use working CLE this afternoon  therefore will undergo GETA. Utx at umbilicus . No prior pelvic surgeries . I have discussed the risks of the procedure with her and the consent has been signed . EBL 200 cc, labs reviewed . NPO . Proceed

## 2023-04-06 NOTE — Anesthesia Preprocedure Evaluation (Signed)
Anesthesia Evaluation  Patient identified by MRN, date of birth, ID band Patient awake    Reviewed: Allergy & Precautions, NPO status , Patient's Chart, lab work & pertinent test results  History of Anesthesia Complications Negative for: history of anesthetic complications  Airway Mallampati: III   Neck ROM: Full    Dental   Pulmonary neg pulmonary ROS   Pulmonary exam normal breath sounds clear to auscultation       Cardiovascular Exercise Tolerance: Good negative cardio ROS Normal cardiovascular exam Rhythm:Regular Rate:Normal     Neuro/Psych negative neurological ROS     GI/Hepatic negative GI ROS,,,  Endo/Other  negative endocrine ROS    Renal/GU negative Renal ROS     Musculoskeletal   Abdominal   Peds  Hematology  (+) Blood dyscrasia, anemia   Anesthesia Other Findings 22 yo [id number] at 70 4/7 requesting labor epidural.  Reproductive/Obstetrics                             Anesthesia Physical Anesthesia Plan  ASA: 2  Anesthesia Plan: Epidural   Post-op Pain Management:    Induction:   PONV Risk Score and Plan: 2 and Treatment may vary due to age or medical condition  Airway Management Planned: Natural Airway  Additional Equipment:   Intra-op Plan:   Post-operative Plan:   Informed Consent: I have reviewed the patients History and Physical, chart, labs and discussed the procedure including the risks, benefits and alternatives for the proposed anesthesia with the patient or authorized representative who has indicated his/her understanding and acceptance.     Dental Advisory Given  Plan Discussed with:   Anesthesia Plan Comments: (Patient reports no bleeding problems and no anticoagulant use.   Patient consented for risks of anesthesia including but not limited to:  - adverse reactions to medications - risk of bleeding, infection and or nerve damage from epidural  that could lead to paralysis - risk of headache or failed epidural - nerve damage due to positioning - that if epidural is used for C-section that there is a chance of epidural failure requiring spinal placement or conversion to GA - damage to heart, brain, lungs, other parts of body or loss of life  Patient voiced understanding and assent.)       Anesthesia Quick Evaluation

## 2023-04-06 NOTE — Anesthesia Postprocedure Evaluation (Signed)
Anesthesia Post Note  Patient: Carolyn Thompson  Procedure(s) Performed: AN AD HOC LABOR EPIDURAL  Patient location during evaluation: Mother Baby Anesthesia Type: Epidural Level of consciousness: awake and alert Pain management: pain level controlled Vital Signs Assessment: post-procedure vital signs reviewed and stable Respiratory status: spontaneous breathing, nonlabored ventilation and respiratory function stable Cardiovascular status: stable Postop Assessment: no headache, no backache and epidural receding Anesthetic complications: no  No notable events documented.   Last Vitals:  Vitals:   04/06/23 0752 04/06/23 0815  BP:  110/62  Pulse:  87  Resp:    Temp:    SpO2: 99% 98%    Last Pain:  Vitals:   04/06/23 0701  TempSrc:   PainSc: 0-No pain                 Stephanie Coup

## 2023-04-07 ENCOUNTER — Encounter: Payer: Self-pay | Admitting: Obstetrics and Gynecology

## 2023-04-07 LAB — CBC
HCT: 34.8 % — ABNORMAL LOW (ref 36.0–46.0)
Hemoglobin: 11.2 g/dL — ABNORMAL LOW (ref 12.0–15.0)
MCH: 24.9 pg — ABNORMAL LOW (ref 26.0–34.0)
MCHC: 32.2 g/dL (ref 30.0–36.0)
MCV: 77.5 fL — ABNORMAL LOW (ref 80.0–100.0)
Platelets: 183 10*3/uL (ref 150–400)
RBC: 4.49 MIL/uL (ref 3.87–5.11)
RDW: 19.7 % — ABNORMAL HIGH (ref 11.5–15.5)
WBC: 13 10*3/uL — ABNORMAL HIGH (ref 4.0–10.5)
nRBC: 0 % (ref 0.0–0.2)

## 2023-04-07 MED ORDER — WITCH HAZEL-GLYCERIN EX PADS: 1.0000 | MEDICATED_PAD | CUTANEOUS | Status: AC | PRN

## 2023-04-07 MED ORDER — COCONUT OIL OIL: 1.0000 | TOPICAL_OIL | Status: AC | PRN

## 2023-04-07 MED ORDER — FERROUS SULFATE 325 (65 FE) MG PO TABS: 325.0000 mg | ORAL_TABLET | Freq: Every day | ORAL | Status: AC

## 2023-04-07 MED ORDER — IBUPROFEN 600 MG PO TABS: 600.0000 mg | ORAL_TABLET | Freq: Four times a day (QID) | ORAL | 0 refills | Status: AC

## 2023-04-07 MED ORDER — OXYCODONE HCL 5 MG PO TABS: 5.0000 mg | ORAL_TABLET | Freq: Four times a day (QID) | ORAL | 0 refills | Status: AC | PRN

## 2023-04-07 MED ORDER — SIMETHICONE 80 MG PO CHEW: 80.0000 mg | CHEWABLE_TABLET | ORAL | Status: AC | PRN

## 2023-04-07 MED ORDER — SENNOSIDES-DOCUSATE SODIUM 8.6-50 MG PO TABS: 2.0000 | ORAL_TABLET | Freq: Every day | ORAL | Status: AC

## 2023-04-07 MED ORDER — ACETAMINOPHEN 500 MG PO TABS: 1000.0000 mg | ORAL_TABLET | Freq: Four times a day (QID) | ORAL | 0 refills | Status: AC

## 2023-04-07 MED ORDER — BENZOCAINE-MENTHOL 20-0.5 % EX AERO: 1.0000 | INHALATION_SPRAY | CUTANEOUS | Status: AC | PRN

## 2023-04-07 MED ORDER — DIBUCAINE (PERIANAL) 1 % EX OINT: 1.0000 | TOPICAL_OINTMENT | CUTANEOUS | Status: AC | PRN

## 2023-04-07 NOTE — Progress Notes (Signed)
Discharge instructions reviewed with patient.  Follow up care reviewed and questions answered.  Printed copies given to patient for reference after discharge home.

## 2023-04-16 LAB — SURGICAL PATHOLOGY

## 2023-06-22 ENCOUNTER — Encounter: Payer: Self-pay | Admitting: Obstetrics and Gynecology

## 2024-03-05 ENCOUNTER — Encounter: Payer: Self-pay | Admitting: Intensive Care

## 2024-03-05 ENCOUNTER — Other Ambulatory Visit: Payer: Self-pay

## 2024-03-05 ENCOUNTER — Emergency Department
Admission: EM | Admit: 2024-03-05 | Discharge: 2024-03-05 | Disposition: A | Discharge status: Home / Self Care | Payer: Self-pay | Attending: Emergency Medicine | Admitting: Emergency Medicine

## 2024-03-05 DIAGNOSIS — J069 Acute upper respiratory infection, unspecified: Secondary | ICD-10-CM | POA: Diagnosis not present

## 2024-03-05 DIAGNOSIS — B9789 Other viral agents as the cause of diseases classified elsewhere: Secondary | ICD-10-CM | POA: Diagnosis not present

## 2024-03-05 DIAGNOSIS — R059 Cough, unspecified: Secondary | ICD-10-CM | POA: Diagnosis present

## 2024-03-05 LAB — RESP PANEL BY RT-PCR (RSV, FLU A&B, COVID)  RVPGX2
Influenza A by PCR: NEGATIVE
Influenza B by PCR: NEGATIVE
Resp Syncytial Virus by PCR: NEGATIVE
SARS Coronavirus 2 by RT PCR: NEGATIVE

## 2024-03-05 NOTE — Discharge Instructions (Signed)
 You tested negative for flu, COVID and RSV today.  You can take cold medicine as needed to treat your symptoms.  Return to the emergency department with any worsening symptoms.

## 2024-03-05 NOTE — ED Triage Notes (Signed)
 Mom c/o cough, congestion and fever

## 2024-03-05 NOTE — ED Provider Notes (Signed)
[facility] Provider Note    Event Date/Time   First MD Initiated Contact with Patient 03/05/24 1707     (approximate)   History   Cough   HPI  Carolyn Thompson is a 23 y.o. female with PMH of cough who presents for evaluation of a cough. Patient endorses cough, congestion and fever that began last week.  Patient is also here with her 3 children who have similar symptoms.      Physical Exam   Triage Vital Signs: ED Triage Vitals  Encounter Vitals Group     BP 03/05/24 1633 122/63     Girls Systolic BP Percentile --      Girls Diastolic BP Percentile --      Boys Systolic BP Percentile --      Boys Diastolic BP Percentile --      Pulse Rate 03/05/24 1633 100     Resp 03/05/24 1633 16     Temp 03/05/24 1629 98.5 F (36.9 C)     Temp Source 03/05/24 1629 Oral     SpO2 03/05/24 1633 97 %     Weight 03/05/24 1630 139 lb 3.2 oz (63.1 kg)     Height 03/05/24 1630 5' 4 (1.626 m)     Head Circumference --      Peak Flow --      Pain Score 03/05/24 1630 5     Pain Loc --      Pain Education --      Exclude from Growth Chart --     Most recent vital signs: Vitals:   03/05/24 1629 03/05/24 1633  BP:  122/63  Pulse:  100  Resp:  16  Temp: 98.5 F (36.9 C)   SpO2:  97%    General: Awake, no distress.  CV:  Good peripheral perfusion.  RRR. Resp:  Normal effort.  CTAB. Abd:  No distention.  Other:     ED Results / Procedures / Treatments   Labs (all labs ordered are listed, but only abnormal results are displayed) Labs Reviewed  RESP PANEL BY RT-PCR (RSV, FLU A&B, COVID)  RVPGX2    PROCEDURES:  Critical Care performed: No  Procedures   MEDICATIONS ORDERED IN ED: Medications - No data to display   IMPRESSION / MDM / ASSESSMENT AND PLAN / ED COURSE  I reviewed the triage vital signs and the nursing notes.                             23 year old female presents for evaluation of URI symptoms.  Vital signs stable patient  NAD on exam.  Differential diagnosis includes, but is not limited to, flu, COVID, RSV, other viral URI.  Patient's presentation is most consistent with acute, uncomplicated illness.  Respiratory panel is negative.  Suspect other viral URI.  Advised symptomatic management using over-the-counter cold medicine.  Patient was given a note for work.  She voiced understanding, all questions were answered and she was stable at discharge.      FINAL CLINICAL IMPRESSION(S) / ED DIAGNOSES   Final diagnoses:  Viral URI with cough     Rx / DC Orders   ED Discharge Orders     None        Note:  This document was prepared using Dragon voice recognition software and may include unintentional dictation errors.   Cleaster Tinnie LABOR, PA-C 03/05/24 1757    Floy Roberts, MD  03/05/24 1803  

## 2024-05-11 ENCOUNTER — Emergency Department
Admission: EM | Admit: 2024-05-11 | Discharge: 2024-05-11 | Disposition: A | Discharge status: Home / Self Care | Attending: Emergency Medicine | Admitting: Emergency Medicine

## 2024-05-11 ENCOUNTER — Other Ambulatory Visit: Payer: Self-pay

## 2024-05-11 DIAGNOSIS — R112 Nausea with vomiting, unspecified: Secondary | ICD-10-CM | POA: Diagnosis not present

## 2024-05-11 DIAGNOSIS — R059 Cough, unspecified: Secondary | ICD-10-CM | POA: Diagnosis present

## 2024-05-11 LAB — RESP PANEL BY RT-PCR (RSV, FLU A&B, COVID)  RVPGX2
Influenza A by PCR: NEGATIVE
Influenza B by PCR: NEGATIVE
Resp Syncytial Virus by PCR: NEGATIVE
SARS Coronavirus 2 by RT PCR: NEGATIVE

## 2024-05-11 MED ORDER — ONDANSETRON 4 MG PO TBDP: 4.0000 mg | ORAL_TABLET | Freq: Three times a day (TID) | ORAL | 0 refills | Status: AC | PRN

## 2024-05-11 MED ORDER — ONDANSETRON 4 MG PO TBDP: 4.0000 mg | ORAL_TABLET | Freq: Once | ORAL | Status: DC

## 2024-05-11 NOTE — ED Triage Notes (Signed)
 Patient states cough, runny nose and vomiting x 2 days. Kids are here with the same.

## 2024-05-11 NOTE — ED Provider Notes (Signed)
 [facility] Provider Note    Event Date/Time   First MD Initiated Contact with Patient 05/11/24 1537     (approximate)   History   Emesis   HPI  Carolyn Thompson is a 23 y.o. female with PMH of anemia who presents for evaluation of cough, runny nose and vomiting for 2 days.  Patient is here in the ED with all 3 of her kids who have the same symptoms.  No fevers at home.      Physical Exam   Triage Vital Signs: ED Triage Vitals  Encounter Vitals Group     BP 05/11/24 1518 137/68     Girls Systolic BP Percentile --      Girls Diastolic BP Percentile --      Boys Systolic BP Percentile --      Boys Diastolic BP Percentile --      Pulse Rate 05/11/24 1518 88     Resp 05/11/24 1518 20     Temp 05/11/24 1518 98.2 F (36.8 C)     Temp Source 05/11/24 1518 Oral     SpO2 05/11/24 1518 100 %     Weight 05/11/24 1538 141 lb 1.5 oz (64 kg)     Height 05/11/24 1518 5' 5 (1.651 m)     Head Circumference --      Peak Flow --      Pain Score 05/11/24 1518 0     Pain Loc --      Pain Education --      Exclude from Growth Chart --     Most recent vital signs: Vitals:   05/11/24 1518  BP: 137/68  Pulse: 88  Resp: 20  Temp: 98.2 F (36.8 C)  SpO2: 100%    General: Awake, no distress.  CV:  Good peripheral perfusion.  RRR Resp:  Normal effort.  CTAB. Abd:  No distention.  No tenderness to palpation. Other:  Oral mucous membranes are moist, no pharyngeal erythema, no tonsillar enlargement or exudates.  Bilateral TMs translucent.   ED Results / Procedures / Treatments   Labs (all labs ordered are listed, but only abnormal results are displayed) Labs Reviewed  RESP PANEL BY RT-PCR (RSV, FLU A&B, COVID)  RVPGX2     PROCEDURES:  Critical Care performed: No  Procedures   MEDICATIONS ORDERED IN ED: Medications - No data to display   IMPRESSION / MDM / ASSESSMENT AND PLAN / ED COURSE  I reviewed the triage vital signs and the nursing  notes.                             23 year old female presents for evaluation of vomiting and other URI symptoms.  Vital signs are stable patient NAD on exam.  Differential diagnosis includes, but is not limited to, flu, COVID, RSV, viral gastroenteritis.  Patient's presentation is most consistent with acute complicated illness / injury requiring diagnostic workup.  Respiratory panel is negative.  Suspect patient's symptoms are due to a viral illness.  Physical exam overall reassuring.  Patient is well-appearing.  Will send some nausea medication to the pharmacy for her to take.  Advised symptomatic management using over-the-counter cold medicines.  Patient voiced understanding, questions were answered and she was stable at discharge.      FINAL CLINICAL IMPRESSION(S) / ED DIAGNOSES   Final diagnoses:  Nausea and vomiting, unspecified vomiting type     Rx / DC Orders  ED Discharge Orders          Ordered    ondansetron  (ZOFRAN -ODT) 4 MG disintegrating tablet  Every 8 hours PRN        05/11/24 1628             Note:  This document was prepared using Dragon voice recognition software and may include unintentional dictation errors.   Cleaster Tinnie LABOR, PA-C 05/11/24 1750    Arlander Charleston, MD 05/11/24 [phone]

## 2024-05-11 NOTE — Discharge Instructions (Addendum)
 You tested negative for flu, COVID and RSV.  Your symptoms are consistent with a viral illness which will resolve on its own with time.  You do not need an antibiotic.  You can take over-the-counter cold medicine as needed to manage your symptoms.  If you are taking combination cold medicine keep in mind that this often contains Tylenol  so if you need additional medication for body aches or fever control please take Motrin  or ibuprofen .  Your symptoms should resolve with time, if you have had symptoms for greater than 10 days please be evaluated by another healthcare provider as at this point it may have developed into a bacterial infection which requires a different treatment.  I have sent a nausea medication to the pharmacy called Zofran .  This can be taken every 8 hours as needed for nausea and vomiting.  Return to the emergency department with worsening symptoms.
